# Patient Record
Sex: Female | Born: 1990
Health system: Southern US, Community
[De-identification: ages and names within clinical notes are randomized; demographics above are authoritative.]

## PROBLEM LIST (undated history)

## (undated) DIAGNOSIS — J45909 Unspecified asthma, uncomplicated: Secondary | ICD-10-CM

---

## 2014-11-18 ENCOUNTER — Emergency Department (HOSPITAL_BASED_OUTPATIENT_CLINIC_OR_DEPARTMENT_OTHER): Payer: Medicaid Other

## 2014-11-18 ENCOUNTER — Emergency Department (HOSPITAL_BASED_OUTPATIENT_CLINIC_OR_DEPARTMENT_OTHER)
Admission: EM | Admit: 2014-11-18 | Discharge: 2014-11-18 | Disposition: A | Discharge status: Home / Self Care | Payer: Medicaid Other | Attending: Emergency Medicine | Admitting: Emergency Medicine

## 2014-11-18 ENCOUNTER — Encounter (HOSPITAL_BASED_OUTPATIENT_CLINIC_OR_DEPARTMENT_OTHER): Payer: Self-pay | Admitting: Emergency Medicine

## 2014-11-18 DIAGNOSIS — Z3A00 Weeks of gestation of pregnancy not specified: Secondary | ICD-10-CM | POA: Diagnosis not present

## 2014-11-18 DIAGNOSIS — O209 Hemorrhage in early pregnancy, unspecified: Secondary | ICD-10-CM

## 2014-11-18 DIAGNOSIS — O98311 Other infections with a predominantly sexual mode of transmission complicating pregnancy, first trimester: Secondary | ICD-10-CM | POA: Insufficient documentation

## 2014-11-18 DIAGNOSIS — A6000 Herpesviral infection of urogenital system, unspecified: Secondary | ICD-10-CM

## 2014-11-18 DIAGNOSIS — A6004 Herpesviral vulvovaginitis: Secondary | ICD-10-CM | POA: Diagnosis not present

## 2014-11-18 LAB — URINE MICROSCOPIC-ADD ON

## 2014-11-18 LAB — CBC WITH DIFFERENTIAL/PLATELET
BASOS ABS: 0 10*3/uL (ref 0.0–0.1)
BASOS PCT: 0 % (ref 0–1)
EOS PCT: 1 % (ref 0–5)
Eosinophils Absolute: 0 10*3/uL (ref 0.0–0.7)
HCT: 33.2 % — ABNORMAL LOW (ref 36.0–46.0)
HEMOGLOBIN: 11.1 g/dL — AB (ref 12.0–15.0)
LYMPHS ABS: 0.6 10*3/uL — AB (ref 0.7–4.0)
LYMPHS PCT: 18 % (ref 12–46)
MCH: 23.3 pg — AB (ref 26.0–34.0)
MCHC: 33.4 g/dL (ref 30.0–36.0)
MCV: 69.7 fL — AB (ref 78.0–100.0)
Monocytes Absolute: 0.3 10*3/uL (ref 0.1–1.0)
Monocytes Relative: 8 % (ref 3–12)
NEUTROS ABS: 2.6 10*3/uL (ref 1.7–7.7)
NEUTROS PCT: 73 % (ref 43–77)
PLATELETS: 254 10*3/uL (ref 150–400)
RBC: 4.76 MIL/uL (ref 3.87–5.11)
RDW: 14.5 % (ref 11.5–15.5)
WBC: 3.5 10*3/uL — ABNORMAL LOW (ref 4.0–10.5)

## 2014-11-18 LAB — WET PREP, GENITAL
Trich, Wet Prep: NONE SEEN
YEAST WET PREP: NONE SEEN

## 2014-11-18 LAB — URINALYSIS, ROUTINE W REFLEX MICROSCOPIC
BILIRUBIN URINE: NEGATIVE
GLUCOSE, UA: NEGATIVE mg/dL
Ketones, ur: >80 mg/dL — AB
NITRITE: NEGATIVE
PROTEIN: NEGATIVE mg/dL
SPECIFIC GRAVITY, URINE: 1.031 — AB (ref 1.005–1.030)
Urobilinogen, UA: 2 mg/dL — ABNORMAL HIGH (ref 0.0–1.0)
pH: 6.5 (ref 5.0–8.0)

## 2014-11-18 LAB — RH IG WORKUP (INCLUDES ABO/RH)
ABO/RH(D): A POS
Gestational Age(Wks): 5

## 2014-11-18 LAB — BASIC METABOLIC PANEL
Anion gap: 8 (ref 5–15)
BUN: 8 mg/dL (ref 6–23)
CALCIUM: 8.8 mg/dL (ref 8.4–10.5)
CHLORIDE: 105 meq/L (ref 96–112)
CO2: 21 mmol/L (ref 19–32)
Creatinine, Ser: 0.69 mg/dL (ref 0.50–1.10)
GFR calc Af Amer: >90 mL/min (ref >90)
GFR calc non Af Amer: >90 mL/min (ref >90)
GLUCOSE: 78 mg/dL (ref 70–99)
Potassium: 3.3 mmol/L — ABNORMAL LOW (ref 3.5–5.1)
Sodium: 134 mmol/L — ABNORMAL LOW (ref 135–145)

## 2014-11-18 LAB — HCG, QUANTITATIVE, PREGNANCY: hCG, Beta Chain, Quant, S: 11201 m[IU]/mL — ABNORMAL HIGH (ref <5)

## 2014-11-18 LAB — PREGNANCY, URINE: PREG TEST UR: POSITIVE — AB

## 2014-11-18 MED ORDER — VALACYCLOVIR HCL 1 G PO TABS: 1000.0000 mg | ORAL_TABLET | Freq: Two times a day (BID) | ORAL | Status: AC

## 2014-11-18 MED ORDER — ACETAMINOPHEN 325 MG PO TABS
650.0000 mg | ORAL_TABLET | Freq: Once | ORAL | Status: AC
  Administered 2014-11-18: 650 mg via ORAL
  Filled 2014-11-18: qty 2

## 2014-11-18 NOTE — ED Notes (Signed)
LMP  10/09/14  spotting

## 2014-11-18 NOTE — ED Notes (Signed)
Pt states nausea and cramping off and on since yesterday and today she felt dizzy at work. Pt denies any change in daily eating/drinking habits or other symptoms.  Home pregnancy test showed positive per patient. Pt states vaginal bleeding/spotting noticed today.

## 2014-11-18 NOTE — ED Provider Notes (Signed)
CSN: [id number]     Arrival date & time 11/18/14  1513 History   First MD Initiated Contact with Patient 11/18/14 1714     No chief complaint on file.    (Consider location/radiation/quality/duration/timing/severity/associated sxs/prior Treatment) HPI Comments: Pt comes in with complaint of spotting during pregnancy. Pt states that she is having some lower abdominal cramping since yesterday. Spotting with urination. No previous pregnancy. Pt states that she also has a rash in her vaginal area that is painful if anything touches her. Denies vomiting or fever.  The history is provided by the patient. No language interpreter was used.    History reviewed. No pertinent past medical history. History reviewed. No pertinent past surgical history. No family history on file. History  Substance Use Topics  . Smoking status: Never Smoker   . Smokeless tobacco: Not on file  . Alcohol Use: No   OB History    Gravida Para Term Preterm AB TAB SAB Ectopic Multiple Living   1              Review of Systems  All other systems reviewed and are negative.     Allergies  Review of patient's allergies indicates not on file.  Home Medications   Prior to Admission medications   Not on File   BP 116/63 mmHg  Pulse 107  Temp(Src) 100.5 F (38.1 C) (Oral)  Resp 16  Ht  (1.626 m)  Wt 140 lb (63.504 kg)  BMI 24.02 kg/m2  SpO2 100%  LMP 10/09/2014 Physical Exam  Constitutional: She is oriented to person, place, and time. She appears well-developed and well-nourished.  Cardiovascular: Normal rate and regular rhythm.   Pulmonary/Chest: Effort normal and breath sounds normal.  Abdominal: Soft. Bowel sounds are normal. There is no tenderness.  Genitourinary:  Brown vaginal discharge. Indurated sores noted ot external labia  Musculoskeletal: Normal range of motion.  Neurological: She is alert and oriented to person, place, and time. Coordination normal.  Skin: Skin is warm and dry.   Psychiatric: She has a normal mood and affect.  Nursing note and vitals reviewed.   ED Course  Procedures (including critical care time) Labs Review Labs Reviewed  WET PREP, GENITAL - Abnormal; Notable for the following:    Clue Cells Wet Prep HPF POC FEW (*)    WBC, Wet Prep HPF POC FEW (*)    All other components within normal limits  URINALYSIS, ROUTINE W REFLEX MICROSCOPIC - Abnormal; Notable for the following:    Color, Urine AMBER (*)    APPearance CLOUDY (*)    Specific Gravity, Urine 1.031 (*)    Hgb urine dipstick MODERATE (*)    Ketones, ur >80 (*)    Urobilinogen, UA 2.0 (*)    Leukocytes, UA TRACE (*)    All other components within normal limits  PREGNANCY, URINE - Abnormal; Notable for the following:    Preg Test, Ur POSITIVE (*)    All other components within normal limits  URINE MICROSCOPIC-ADD ON - Abnormal; Notable for the following:    Squamous Epithelial / LPF FEW (*)    Bacteria, UA MANY (*)    All other components within normal limits  BASIC METABOLIC PANEL - Abnormal; Notable for the following:    Sodium 134 (*)    Potassium 3.3 (*)    All other components within normal limits  CBC WITH DIFFERENTIAL - Abnormal; Notable for the following:    WBC 3.5 (*)    Hemoglobin 11.1 (*)  HCT 33.2 (*)    MCV 69.7 (*)    MCH 23.3 (*)    Lymphs Abs 0.6 (*)    All other components within normal limits  HCG, QUANTITATIVE, PREGNANCY - Abnormal; Notable for the following:    hCG, Beta Chain, Quant, S 11201 (*)    All other components within normal limits  GC/CHLAMYDIA PROBE AMP  HERPES SIMPLEX VIRUS CULTURE  HIV ANTIBODY (ROUTINE TESTING)  RH IG WORKUP (INCLUDES ABO/RH)    Imaging Review US Ob Comp Less 14 Wks  11/18/2014   CLINICAL DATA:  Vaginal spotting, cramping, first trimester of pregnancy.  EXAM: OBSTETRIC <14 WK [place] AND TRANSVAGINAL OB US  TECHNIQUE: Both transabdominal and transvaginal ultrasound examinations were performed for complete evaluation of  the gestation as well as the maternal uterus, adnexal regions, and pelvic cul-de-sac. Transvaginal technique was performed to assess early pregnancy.  COMPARISON:  None.  FINDINGS: Intrauterine gestational sac: Visualized/normal in shape.  Yolk sac:  Not visualized.  Embryo:  Not visualized.  Cardiac Activity: Not visualized.  MSD:  11.6  mm   6 w   0  d  [place] [facility]: July 14, 2015.  Maternal uterus/adnexae: Normal right ovary. Trace free fluid is noted. 3.3 cm heterogeneous area seen in left ovary most consistent with corpus luteum cyst.  IMPRESSION: Probable early intrauterine gestational sac, but no yolk sac, fetal pole, or cardiac activity yet visualized. Recommend follow-up quantitative B-HCG levels and follow-up US in 14 days to confirm and assess viability. This recommendation follows SRU consensus guidelines: Diagnostic Criteria for Nonviable Pregnancy Early in the First Trimester. Malva Limes Med 2013; 914:7829-56.   Electronically Signed   By: Roque Lias M.D.   On: 11/18/2014 18:45   US Ob Transvaginal  11/18/2014   CLINICAL DATA:  Vaginal spotting, cramping, first trimester of pregnancy.  EXAM: OBSTETRIC <14 WK [place] AND TRANSVAGINAL OB US  TECHNIQUE: Both transabdominal and transvaginal ultrasound examinations were performed for complete evaluation of the gestation as well as the maternal uterus, adnexal regions, and pelvic cul-de-sac. Transvaginal technique was performed to assess early pregnancy.  COMPARISON:  None.  FINDINGS: Intrauterine gestational sac: Visualized/normal in shape.  Yolk sac:  Not visualized.  Embryo:  Not visualized.  Cardiac Activity: Not visualized.  MSD:  11.6  mm   6 w   0  d  [place] [facility]: July 14, 2015.  Maternal uterus/adnexae: Normal right ovary. Trace free fluid is noted. 3.3 cm heterogeneous area seen in left ovary most consistent with corpus luteum cyst.  IMPRESSION: Probable early intrauterine gestational sac, but no yolk sac, fetal pole, or cardiac activity yet visualized.  Recommend follow-up quantitative B-HCG levels and follow-up US in 14 days to confirm and assess viability. This recommendation follows SRU consensus guidelines: Diagnostic Criteria for Nonviable Pregnancy Early in the First Trimester. Malva Limes Med 2013; 213:0865-78.   Electronically Signed   By: Roque Lias M.D.   On: 11/18/2014 18:45     EKG Interpretation None      MDM   Final diagnoses:  Herpes genitalia  Vaginal bleeding in pregnancy, first trimester    Will treat for herpes. Pt a pos so no rho gam needed. [place] consistent with iup. Pt has ob at [facility]. Pt given return precuations    Teressa Lower, NP 11/18/14 2133  Rolan Bucco, MD 11/18/14 2243

## 2014-11-18 NOTE — Discharge Instructions (Signed)
You need to start taking your prenatal vitamins. You need to schedule and appointment with your ob. if if you bleed more then one pad per hour then you need to be seen again Genital Herpes Genital herpes is a sexually transmitted disease. This means that it is a disease passed by having sex with an infected person. There is no cure for genital herpes. The time between attacks can be months to years. The virus may live in a person but produce no problems (symptoms). This infection can be passed to a baby as it travels down the birth canal (vagina). In a newborn, this can cause central nervous system damage, eye damage, or even death. The virus that causes genital herpes is usually HSV-2 virus. The virus that causes oral herpes is usually HSV-1. The diagnosis (learning what is wrong) is made through culture results. SYMPTOMS  Usually symptoms of pain and itching begin a few days to a week after contact. It first appears as small blisters that progress to small painful ulcers which then scab over and heal after several days. It affects the outer genitalia, birth canal, cervix, penis, anal area, buttocks, and thighs. HOME CARE INSTRUCTIONS   Keep ulcerated areas dry and clean.  Take medications as directed. Antiviral medications can speed up healing. They will not prevent recurrences or cure this infection. These medications can also be taken for suppression if there are frequent recurrences.  While the infection is active, it is contagious. Avoid all sexual contact during active infections.  Condoms may help prevent spread of the herpes virus.  Practice safe sex.  Wash your hands thoroughly after touching the genital area.  Avoid touching your eyes after touching your genital area.  Inform your caregiver if you have had genital herpes and become pregnant. It is your responsibility to insure a safe outcome for your baby in this pregnancy.  Only take over-the-counter or prescription medicines for  pain, discomfort, or fever as directed by your caregiver. SEEK MEDICAL CARE IF:   You have a recurrence of this infection.  You do not respond to medications and are not improving.  You have new sources of pain or discharge which have changed from the original infection.  You have an oral temperature above 102 F (38.9 C).  You develop abdominal pain.  You develop eye pain or signs of eye infection. Document Released: 11/01/2000 Document Revised: 01/27/2012 Document Reviewed: 11/22/2009 [facility] Patient Information 2015 [place], [place]. This information is not intended to replace advice given to you by your health care provider. Make sure you discuss any questions you have with your health care provider.  Vaginal Bleeding During Pregnancy, First Trimester A small amount of bleeding (spotting) from the vagina is common in early pregnancy. Sometimes the bleeding is normal and is not a problem, and sometimes it is a sign of something serious. Be sure to tell your doctor about any bleeding from your vagina right away. HOME CARE  Watch your condition for any changes.  Follow your doctor's instructions about how active you can be.  If you are on bed rest:  You may need to stay in bed and only get up to use the bathroom.  You may be allowed to do some activities.  If you need help, make plans for someone to help you.  Write down:  The number of pads you use each day.  How often you change pads.  How soaked (saturated) your pads are.  Do not use tampons.  Do not  douche.  Do not have sex or orgasms until your doctor says it is okay.  If you pass any tissue from your vagina, save the tissue so you can show it to your doctor.  Only take medicines as told by your doctor.  Do not take aspirin because it can make you bleed.  Keep all follow-up visits as told by your doctor. GET HELP IF:   You bleed from your vagina.  You have cramps.  You have labor pains.  You have a  fever that does not go away after you take medicine. GET HELP RIGHT AWAY IF:   You have very bad cramps in your back or belly (abdomen).  You pass large clots or tissue from your vagina.  You bleed more.  You feel light-headed or weak.  You pass out (faint).  You have chills.  You are leaking fluid or have a gush of fluid from your vagina.  You pass out while pooping (having a bowel movement). MAKE SURE YOU:  Understand these instructions.  Will watch your condition.  Will get help right away if you are not doing well or get worse. Document Released: 03/21/2014 Document Reviewed: 07/12/2013 [facility] Patient Information 2015 [place], [place]. This information is not intended to replace advice given to you by your health care provider. Make sure you discuss any questions you have with your health care provider.

## 2014-11-20 LAB — HIV ANTIBODY (ROUTINE TESTING W REFLEX): HIV 1&2 Ab, 4th Generation: NONREACTIVE

## 2014-11-21 LAB — HERPES SIMPLEX VIRUS CULTURE: CULTURE: NOT DETECTED

## 2014-11-23 LAB — GC/CHLAMYDIA PROBE AMP
CT Probe RNA: POSITIVE — AB
GC Probe RNA: POSITIVE — AB

## 2014-11-24 ENCOUNTER — Telehealth (HOSPITAL_BASED_OUTPATIENT_CLINIC_OR_DEPARTMENT_OTHER): Payer: Self-pay | Admitting: Emergency Medicine

## 2014-11-24 NOTE — Telephone Encounter (Signed)
+  GC and + chlamydia, not treated, chart handoff to EDP for treatment

## 2014-11-28 ENCOUNTER — Telehealth (HOSPITAL_BASED_OUTPATIENT_CLINIC_OR_DEPARTMENT_OTHER): Payer: Self-pay | Admitting: *Deleted

## 2014-11-29 ENCOUNTER — Telehealth (HOSPITAL_BASED_OUTPATIENT_CLINIC_OR_DEPARTMENT_OTHER): Payer: Self-pay | Admitting: *Deleted

## 2014-11-30 ENCOUNTER — Telehealth (HOSPITAL_COMMUNITY): Payer: Self-pay

## 2014-11-30 NOTE — ED Notes (Signed)
Spoke with pt. Verified ID. Informed of lab results. Positive for gonorrhea and chlamydia. Pt states that she has already been informed and was treated. Does not remember the name of the medication. It was a one time only medication. She has an MD appointment next week. I advised that she ask to be retested to make sure everything is clear.

## 2015-10-07 ENCOUNTER — Emergency Department (HOSPITAL_BASED_OUTPATIENT_CLINIC_OR_DEPARTMENT_OTHER)
Admission: EM | Admit: 2015-10-07 | Discharge: 2015-10-07 | Disposition: A | Discharge status: Home / Self Care | Payer: Medicaid Other | Attending: Emergency Medicine | Admitting: Emergency Medicine

## 2015-10-07 ENCOUNTER — Encounter (HOSPITAL_BASED_OUTPATIENT_CLINIC_OR_DEPARTMENT_OTHER): Payer: Self-pay | Admitting: *Deleted

## 2015-10-07 DIAGNOSIS — Z79899 Other long term (current) drug therapy: Secondary | ICD-10-CM | POA: Insufficient documentation

## 2015-10-07 DIAGNOSIS — O9989 Other specified diseases and conditions complicating pregnancy, childbirth and the puerperium: Secondary | ICD-10-CM | POA: Diagnosis not present

## 2015-10-07 DIAGNOSIS — Z3A21 21 weeks gestation of pregnancy: Secondary | ICD-10-CM | POA: Diagnosis not present

## 2015-10-07 DIAGNOSIS — J069 Acute upper respiratory infection, unspecified: Secondary | ICD-10-CM | POA: Diagnosis not present

## 2015-10-07 DIAGNOSIS — O99512 Diseases of the respiratory system complicating pregnancy, second trimester: Secondary | ICD-10-CM | POA: Insufficient documentation

## 2015-10-07 DIAGNOSIS — J45901 Unspecified asthma with (acute) exacerbation: Secondary | ICD-10-CM | POA: Diagnosis not present

## 2015-10-07 DIAGNOSIS — H9209 Otalgia, unspecified ear: Secondary | ICD-10-CM | POA: Diagnosis not present

## 2015-10-07 HISTORY — DX: Unspecified asthma, uncomplicated: J45.909

## 2015-10-07 MED ORDER — ALBUTEROL SULFATE HFA 108 (90 BASE) MCG/ACT IN AERS: 1.0000 | INHALATION_SPRAY | Freq: Four times a day (QID) | RESPIRATORY_TRACT | Status: DC | PRN

## 2015-10-07 MED ORDER — IPRATROPIUM-ALBUTEROL 0.5-2.5 (3) MG/3ML IN SOLN: 3.0000 mL | RESPIRATORY_TRACT | Status: DC | PRN

## 2015-10-07 MED ORDER — AEROCHAMBER PLUS FLO-VU SMALL MISC
1.0000 | Freq: Once | Status: AC
  Administered 2015-10-07: 1
  Filled 2015-10-07: qty 1

## 2015-10-07 MED ORDER — PREDNISONE 10 MG PO TABS: 40.0000 mg | ORAL_TABLET | Freq: Every day | ORAL | Status: DC

## 2015-10-07 NOTE — ED Provider Notes (Signed)
CSN: [id number]     Arrival date & time 10/07/15  1359 History   First MD Initiated Contact with Patient 10/07/15 1404     Chief Complaint  Patient presents with  . Shortness of Breath   HPI  [name] is a 24 year old female with PMHx of asthma presenting with shortness of breath. She states that she has had congestion for the past 2 days and this has exacerbated her asthma. She states that the congestion makes it feel like her ears are clogged up. She is also complaining of drainage down the back of her throat. States that it doesn't make her throat sore but that it just feels "gross". She has not taken any over-the-counter symptom relievers. She states that she is only short of breath when she is up and moving around. She states that if she is resting the shortness of breath resolves. She is not currently complaining of shortness of breath. She reports that she is out of her rescue inhaler and has not been able to use it in over a week. She is also out of her home nebulizer treatments. She is [redacted] weeks pregnant with appropriate prenatal care. She denies any issues with the pregnancy. She denies fevers, chills, headaches, dizziness, syncope, neck pain, chest pain, cough, chest tightness, abdominal pain, nausea, vomiting, dysuria or vaginal bleeding.  Past Medical History  Diagnosis Date  . Asthma    History reviewed. No pertinent past surgical history. No family history on file. Social History  Substance Use Topics  . Smoking status: Never Smoker   . Smokeless tobacco: None  . Alcohol Use: No   OB History    Gravida Para Term Preterm AB TAB SAB Ectopic Multiple Living   1              Review of Systems  Constitutional: Negative for fever, chills and diaphoresis.  HENT: Positive for congestion, ear pain and postnasal drip. Negative for ear discharge, rhinorrhea, sinus pressure, sore throat and trouble swallowing.   Eyes: Negative for discharge.  Respiratory: Positive for shortness  of breath. Negative for cough, chest tightness and wheezing.   Cardiovascular: Negative for chest pain.  Gastrointestinal: Negative for nausea, vomiting and abdominal pain.  Genitourinary: Negative for dysuria, flank pain and vaginal bleeding.  Musculoskeletal: Negative for myalgias and arthralgias.  Neurological: Negative for dizziness, syncope, weakness and headaches.  All other systems reviewed and are negative.     Allergies  Review of patient's allergies indicates no known allergies.  Home Medications   Prior to Admission medications   Medication Sig Start Date End Date Taking? Authorizing Provider  Prenatal Multivit-Min-Fe-FA (PRENATAL VITAMINS) 0.8 MG tablet Take 1 tablet by mouth daily.   Yes Historical Provider, MD  albuterol (PROVENTIL HFA;VENTOLIN HFA) 108 (90 BASE) MCG/ACT inhaler Inhale 1-2 puffs into the lungs every 6 (six) hours as needed for wheezing or shortness of breath. 10/07/15   Kalab Camps, PA-C  ipratropium-albuterol (DUONEB) 0.5-2.5 (3) MG/3ML SOLN Take 3 mLs by nebulization every 2 (two) hours as needed. 10/07/15   Vikrant Pryce, PA-C  predniSONE (DELTASONE) 10 MG tablet Take 4 tablets (40 mg total) by mouth daily. 10/07/15   Rebekha Diveley, PA-C   BP 112/67 mmHg  Pulse 92  Temp(Src) 98.5 F (36.9 C) (Oral)  Resp 26  Ht 5\' 4"  (1.626 m)  Wt 164 lb (74.39 kg)  BMI 28.14 kg/m2  SpO2 100%  LMP 10/09/2014 Physical Exam  Constitutional: She appears well-developed and well-nourished. No  distress.  HENT:  Head: Normocephalic and atraumatic.  Right Ear: Tympanic membrane, external ear and ear canal normal.  Left Ear: Tympanic membrane, external ear and ear canal normal.  Nose: Mucosal edema present. No rhinorrhea.  Mouth/Throat: Uvula is midline, oropharynx is clear and moist and mucous membranes are normal. Mucous membranes are not dry. No oropharyngeal exudate, posterior oropharyngeal edema or posterior oropharyngeal erythema.  Eyes: Conjunctivae are  normal. Right eye exhibits no discharge. Left eye exhibits no discharge. No scleral icterus.  Neck: Normal range of motion. Neck supple.  Cardiovascular: Normal rate, regular rhythm and normal heart sounds.   Pulmonary/Chest: Effort normal and breath sounds normal. No respiratory distress. She has no wheezes. She has no rales.  Breathing unlabored. No wheezes throughout lung fields.  Musculoskeletal: Normal range of motion.  Lymphadenopathy:    She has no cervical adenopathy.  Neurological: She is alert. Coordination normal.  Skin: Skin is warm and dry.  Psychiatric: She has a normal mood and affect. Her behavior is normal.  Nursing note and vitals reviewed.   ED Course  Procedures (including critical care time) Labs Review Labs Reviewed - No data to display  Imaging Review No results found. I have personally reviewed and evaluated these images and lab results as part of my medical decision-making.   EKG Interpretation None      MDM   Final diagnoses:  Upper respiratory infection, viral  Asthma exacerbation   Patient presenting with congestion and shortness of breath x 2 days. Denies wheezing or cough. She has not tried any over-the-counter symptom relievers. She is out of her rescue inhaler and DuoNeb solution at home. She is not currently short of breath. Vital signs stable. Oxygen 100% on room air. Breathing unlabored. No wheezes or adventitious breath sounds heard throughout. Some nasal mucosal edema noted. TMs normal bilaterally. Oropharynx is not erythematous and without exudate. There are no current signs of respiratory distress. Patient will be discharged with refills for her albuterol and DuoNeb. Pt states they are breathing at baseline. Discussed with patient the risks of taking prednisone while pregnant. Discussed that we will give a very short, low dose prescription burst that she is only to take if her breathing does not improve with albuterol or DuoNebs. Pt has been  instructed to follow up with their PCP regarding today's ED visit. Patient seen and assessed by Dr. Madilyn Hook who agrees with this plan. Return precautions given in discharge paperwork and discussed with pt at bedside. Pt stable for discharge      Alveta Heimlich, PA-C 10/07/15 2341  Tilden Fossa, MD 10/08/15 1718

## 2015-10-07 NOTE — ED Notes (Signed)
Patient c/o SOB that has grown worse over the past two days, she states that she has asthma but does not have an inhaler at this time. [redacted] weeks pregnant

## 2015-10-07 NOTE — Discharge Instructions (Signed)
Schedule a follow-up appointment with your primary care for asthma control. Only use the prednisone if your asthma symptoms are not controlled with your rescue inhaler and DuoNeb. Return to the emergency department with difficulty breathing or further worsening of symptoms.   Asthma, Adult Asthma is a recurring condition in which the airways tighten and narrow. Asthma can make it difficult to breathe. It can cause coughing, wheezing, and shortness of breath. Asthma episodes, also called asthma attacks, range from minor to life-threatening. Asthma cannot be cured, but medicines and lifestyle changes can help control it. CAUSES Asthma is believed to be caused by inherited (genetic) and environmental factors, but its exact cause is unknown. Asthma may be triggered by allergens, lung infections, or irritants in the air. Asthma triggers are different for each person. Common triggers include:   Animal dander.  Dust mites.  Cockroaches.  Pollen from trees or grass.  Mold.  Smoke.  Air pollutants such as dust, household cleaners, hair sprays, aerosol sprays, paint fumes, strong chemicals, or strong odors.  Cold air, weather changes, and winds (which increase molds and pollens in the air).  Strong emotional expressions such as crying or laughing hard.  Stress.  Certain medicines (such as aspirin) or types of drugs (such as beta-blockers).  Sulfites in foods and drinks. Foods and drinks that may contain sulfites include dried fruit, potato chips, and sparkling grape juice.  Infections or inflammatory conditions such as the flu, a cold, or an inflammation of the nasal membranes (rhinitis).  Gastroesophageal reflux disease (GERD).  Exercise or strenuous activity. SYMPTOMS Symptoms may occur immediately after asthma is triggered or many hours later. Symptoms include:  Wheezing.  Excessive nighttime or early morning coughing.  Frequent or severe coughing with a common cold.  Chest  tightness.  Shortness of breath. DIAGNOSIS  The diagnosis of asthma is made by a review of your medical history and a physical exam. Tests may also be performed. These may include:  Lung function studies. These tests show how much air you breathe in and out.  Allergy tests.  Imaging tests such as X-rays. TREATMENT  Asthma cannot be cured, but it can usually be controlled. Treatment involves identifying and avoiding your asthma triggers. It also involves medicines. There are 2 classes of medicine used for asthma treatment:   Controller medicines. These prevent asthma symptoms from occurring. They are usually taken every day.  Reliever or rescue medicines. These quickly relieve asthma symptoms. They are used as needed and provide short-term relief. Your health care provider will help you create an asthma action plan. An asthma action plan is a written plan for managing and treating your asthma attacks. It includes a list of your asthma triggers and how they may be avoided. It also includes information on when medicines should be taken and when their dosage should be changed. An action plan may also involve the use of a device called a peak flow meter. A peak flow meter measures how well the lungs are working. It helps you monitor your condition. HOME CARE INSTRUCTIONS   Take medicines only as directed by your health care provider. Speak with your health care provider if you have questions about how or when to take the medicines.  Use a peak flow meter as directed by your health care provider. Record and keep track of readings.  Understand and use the action plan to help minimize or stop an asthma attack without needing to seek medical care.  Control your home environment in the  following ways to help prevent asthma attacks:  Do not smoke. Avoid being exposed to secondhand smoke.  Change your heating and air conditioning filter regularly.  Limit your use of fireplaces and wood  stoves.  Get rid of pests (such as roaches and mice) and their droppings.  Throw away plants if you see mold on them.  Clean your floors and dust regularly. Use unscented cleaning products.  Try to have someone else vacuum for you regularly. Stay out of rooms while they are being vacuumed and for a short while afterward. If you vacuum, use a dust mask from a hardware store, a double-layered or microfilter vacuum cleaner bag, or a vacuum cleaner with a HEPA filter.  Replace carpet with wood, tile, or vinyl flooring. Carpet can trap dander and dust.  Use allergy-proof pillows, mattress covers, and box spring covers.  Wash bed sheets and blankets every week in hot water and dry them in a dryer.  Use blankets that are made of polyester or cotton.  Clean bathrooms and kitchens with bleach. If possible, have someone repaint the walls in these rooms with mold-resistant paint. Keep out of the rooms that are being cleaned and painted.  Wash hands frequently. SEEK MEDICAL CARE IF:   You have wheezing, shortness of breath, or a cough even if taking medicine to prevent attacks.  The colored mucus you cough up (sputum) is thicker than usual.  Your sputum changes from clear or white to yellow, green, gray, or bloody.  You have any problems that may be related to the medicines you are taking (such as a rash, itching, swelling, or trouble breathing).  You are using a reliever medicine more than 2-3 times per week.  Your peak flow is still at 50-79% of your personal best after following your action plan for 1 hour.  You have a fever. SEEK IMMEDIATE MEDICAL CARE IF:   You seem to be getting worse and are unresponsive to treatment during an asthma attack.  You are short of breath even at rest.  You get short of breath when doing very little physical activity.  You have difficulty eating, drinking, or talking due to asthma symptoms.  You develop chest pain.  You develop a fast  heartbeat.  You have a bluish color to your lips or fingernails.  You are light-headed, dizzy, or faint.  Your peak flow is less than 50% of your personal best.   This information is not intended to replace advice given to you by your health care provider. Make sure you discuss any questions you have with your health care provider.   Document Released: 11/04/2005 Document Revised: 07/26/2015 Document Reviewed: 06/03/2013 Elsevier Interactive Patient Education 2016 Else[employer] Inc.  Viral Infections A viral infection can be caused by different types of viruses.Most viral infections are not serious and resolve on their own. However, some infections may cause severe symptoms and may lead to further complications. SYMPTOMS Viruses can frequently cause:  Minor sore throat.  Aches and pains.  Headaches.  Runny nose.  Different types of rashes.  Watery eyes.  Tiredness.  Cough.  Loss of appetite.  Gastrointestinal infections, resulting in nausea, vomiting, and diarrhea. These symptoms do not respond to antibiotics because the infection is not caused by bacteria. However, you might catch a bacterial infection following the viral infection. This is sometimes called a "superinfection." Symptoms of such a bacterial infection may include:  Worsening sore throat with pus and difficulty swallowing.  Swollen neck glands.  Chills and  a high or persistent fever.  Severe headache.  Tenderness over the sinuses.  Persistent overall ill feeling (malaise), muscle aches, and tiredness (fatigue).  Persistent cough.  Yellow, green, or brown mucus production with coughing. HOME CARE INSTRUCTIONS   Only take over-the-counter or prescription medicines for pain, discomfort, diarrhea, or fever as directed by your caregiver.  Drink enough water and fluids to keep your urine clear or pale yellow. Sports drinks can provide valuable electrolytes, sugars, and hydration.  Get plenty of rest  and maintain proper nutrition. Soups and broths with crackers or rice are fine. SEEK IMMEDIATE MEDICAL CARE IF:   You have severe headaches, shortness of breath, chest pain, neck pain, or an unusual rash.  You have uncontrolled vomiting, diarrhea, or you are unable to keep down fluids.  You or your child has an oral temperature above 102 F (38.9 C), not controlled by medicine.  Your baby is older than 3 months with a rectal temperature of 102 F (38.9 C) or higher.  Your baby is 84 months old or younger with a rectal temperature of 100.4 F (38 C) or higher. MAKE SURE YOU:   Understand these instructions.  Will watch your condition.  Will get help right away if you are not doing well or get worse.   This information is not intended to replace advice given to you by your health care provider. Make sure you discuss any questions you have with your health care provider.   Document Released: 08/14/2005 Document Revised: 01/27/2012 Document Reviewed: 04/12/2015 Elsevier Interactive Patient Education [employer]

## 2017-08-18 ENCOUNTER — Inpatient Hospital Stay (HOSPITAL_BASED_OUTPATIENT_CLINIC_OR_DEPARTMENT_OTHER)
Admission: EM | Admit: 2017-08-18 | Discharge: 2017-08-20 | DRG: 203 | Disposition: A | Discharge status: Home / Self Care | Payer: Medicaid Other | Source: Other Acute Inpatient Hospital | Attending: Internal Medicine | Admitting: Internal Medicine

## 2017-08-18 ENCOUNTER — Encounter (HOSPITAL_BASED_OUTPATIENT_CLINIC_OR_DEPARTMENT_OTHER): Payer: Self-pay

## 2017-08-18 ENCOUNTER — Emergency Department (HOSPITAL_BASED_OUTPATIENT_CLINIC_OR_DEPARTMENT_OTHER): Payer: Medicaid Other

## 2017-08-18 DIAGNOSIS — J4551 Severe persistent asthma with (acute) exacerbation: Secondary | ICD-10-CM | POA: Diagnosis not present

## 2017-08-18 DIAGNOSIS — E876 Hypokalemia: Secondary | ICD-10-CM | POA: Diagnosis present

## 2017-08-18 DIAGNOSIS — J4552 Severe persistent asthma with status asthmaticus: Secondary | ICD-10-CM | POA: Diagnosis present

## 2017-08-18 DIAGNOSIS — J45902 Unspecified asthma with status asthmaticus: Secondary | ICD-10-CM | POA: Diagnosis not present

## 2017-08-18 DIAGNOSIS — Z79899 Other long term (current) drug therapy: Secondary | ICD-10-CM | POA: Diagnosis not present

## 2017-08-18 DIAGNOSIS — Z7952 Long term (current) use of systemic steroids: Secondary | ICD-10-CM | POA: Diagnosis not present

## 2017-08-18 DIAGNOSIS — J45901 Unspecified asthma with (acute) exacerbation: Secondary | ICD-10-CM | POA: Diagnosis present

## 2017-08-18 LAB — BASIC METABOLIC PANEL
ANION GAP: 9 (ref 5–15)
BUN: 6 mg/dL (ref 6–20)
CO2: 19 mmol/L — ABNORMAL LOW (ref 22–32)
Calcium: 9 mg/dL (ref 8.9–10.3)
Chloride: 109 mmol/L (ref 101–111)
Creatinine, Ser: 0.75 mg/dL (ref 0.44–1.00)
Glucose, Bld: 106 mg/dL — ABNORMAL HIGH (ref 65–99)
Potassium: 2.9 mmol/L — ABNORMAL LOW (ref 3.5–5.1)
SODIUM: 137 mmol/L (ref 135–145)

## 2017-08-18 LAB — CBC WITH DIFFERENTIAL/PLATELET
BASOS ABS: 0 10*3/uL (ref 0.0–0.1)
Basophils Relative: 0 %
EOS ABS: 0.1 10*3/uL (ref 0.0–0.7)
Eosinophils Relative: 2 %
HCT: 36.2 % (ref 36.0–46.0)
Hemoglobin: 12 g/dL (ref 12.0–15.0)
Lymphocytes Relative: 6 %
Lymphs Abs: 0.4 10*3/uL — ABNORMAL LOW (ref 0.7–4.0)
MCH: 22.9 pg — ABNORMAL LOW (ref 26.0–34.0)
MCHC: 33.1 g/dL (ref 30.0–36.0)
MCV: 69 fL — ABNORMAL LOW (ref 78.0–100.0)
MONO ABS: 0.4 10*3/uL (ref 0.1–1.0)
Monocytes Relative: 6 %
NEUTROS PCT: 86 %
Neutro Abs: 5.8 10*3/uL (ref 1.7–7.7)
PLATELETS: 359 10*3/uL (ref 150–400)
RBC: 5.25 MIL/uL — AB (ref 3.87–5.11)
RDW: 14.9 % (ref 11.5–15.5)
WBC: 6.7 10*3/uL (ref 4.0–10.5)

## 2017-08-18 LAB — D-DIMER, QUANTITATIVE (NOT AT ARMC)

## 2017-08-18 MED ORDER — ALBUTEROL SULFATE (2.5 MG/3ML) 0.083% IN NEBU: 2.5000 mg | INHALATION_SOLUTION | RESPIRATORY_TRACT | Status: DC | PRN

## 2017-08-18 MED ORDER — MAGNESIUM SULFATE 2 GM/50ML IV SOLN
2.0000 g | Freq: Once | INTRAVENOUS | Status: AC
  Administered 2017-08-18: 2 g via INTRAVENOUS
  Filled 2017-08-18: qty 50

## 2017-08-18 MED ORDER — ONDANSETRON HCL 4 MG PO TABS: 4.0000 mg | ORAL_TABLET | Freq: Four times a day (QID) | ORAL | Status: DC | PRN

## 2017-08-18 MED ORDER — POTASSIUM CHLORIDE CRYS ER 20 MEQ PO TBCR
40.0000 meq | EXTENDED_RELEASE_TABLET | Freq: Once | ORAL | Status: AC
  Administered 2017-08-18: 40 meq via ORAL
  Filled 2017-08-18: qty 2

## 2017-08-18 MED ORDER — ONDANSETRON HCL 4 MG/2ML IJ SOLN
4.0000 mg | Freq: Once | INTRAMUSCULAR | Status: AC
  Administered 2017-08-18: 4 mg via INTRAVENOUS
  Filled 2017-08-18: qty 2

## 2017-08-18 MED ORDER — IPRATROPIUM-ALBUTEROL 0.5-2.5 (3) MG/3ML IN SOLN
3.0000 mL | Freq: Four times a day (QID) | RESPIRATORY_TRACT | Status: DC
  Administered 2017-08-18 – 2017-08-19 (×4): 3 mL via RESPIRATORY_TRACT
  Filled 2017-08-18 (×4): qty 3

## 2017-08-18 MED ORDER — ALBUTEROL (5 MG/ML) CONTINUOUS INHALATION SOLN
10.0000 mg/h | INHALATION_SOLUTION | Freq: Once | RESPIRATORY_TRACT | Status: AC
  Administered 2017-08-18: 10 mg/h via RESPIRATORY_TRACT
  Filled 2017-08-18: qty 20

## 2017-08-18 MED ORDER — ONDANSETRON HCL 4 MG/2ML IJ SOLN: 4.0000 mg | Freq: Four times a day (QID) | INTRAMUSCULAR | Status: DC | PRN

## 2017-08-18 MED ORDER — ACETAMINOPHEN 650 MG RE SUPP: 650.0000 mg | Freq: Four times a day (QID) | RECTAL | Status: DC | PRN

## 2017-08-18 MED ORDER — ACETAMINOPHEN 325 MG PO TABS
650.0000 mg | ORAL_TABLET | Freq: Four times a day (QID) | ORAL | Status: DC | PRN
  Administered 2017-08-18: 650 mg via ORAL
  Filled 2017-08-18: qty 2

## 2017-08-18 MED ORDER — ALBUTEROL SULFATE (2.5 MG/3ML) 0.083% IN NEBU
INHALATION_SOLUTION | RESPIRATORY_TRACT | Status: AC
  Administered 2017-08-18: 5 mg
  Filled 2017-08-18: qty 6

## 2017-08-18 MED ORDER — SODIUM CHLORIDE 0.9 % IV BOLUS (SEPSIS)
500.0000 mL | Freq: Once | INTRAVENOUS | Status: AC
  Administered 2017-08-18: 500 mL via INTRAVENOUS

## 2017-08-18 MED ORDER — METHYLPREDNISOLONE SODIUM SUCC 125 MG IJ SOLR
125.0000 mg | Freq: Once | INTRAMUSCULAR | Status: AC
  Administered 2017-08-18: 125 mg via INTRAVENOUS
  Filled 2017-08-18: qty 2

## 2017-08-18 MED ORDER — METHYLPREDNISOLONE SODIUM SUCC 125 MG IJ SOLR
60.0000 mg | Freq: Two times a day (BID) | INTRAMUSCULAR | Status: DC
  Administered 2017-08-18 – 2017-08-20 (×4): 60 mg via INTRAVENOUS
  Filled 2017-08-18 (×4): qty 2

## 2017-08-18 NOTE — H&P (Signed)
History and Physical    Laura [name] ZOX:[id number] DOB: 06/13/1991 DOA: 08/18/2017  PCP:  Ferdinand Cava - OB/GYN Consultants:  [place] - pulmonologist Patient coming from: Home - lives with mother, daughter; Jackey Loge: mom, [phone]  Chief Complaint: Wheezing, SOB  HPI: Laura [name] is a 26 y.o. female with medical history significant of asthma presenting as a transfer from [facility] due to asthma attack.  She normally has an asthma attack and knows what to expect.  If she can't control it at home she goes to the ER for a treatment and does well.  Today, the dry cough is different, the wheezing is different.  It isn't a normal asthma attack in that she used nebs and her inhaler without improvement.  She started feeling bad on Sunday night - at first she was just stuffy and thought it was allergies or a cold.  About 11pm, her breathing started acting up but it calmed and was better through 0400.  When it worsened, she did a neb without improvement.  Tried a second neb without relief and so went to [facility].  Currently with still some wheezes.  She moved a lot with the transport and that made things worse again.  Tmax 100.8.  Her daughter had a URI last week on Thursday.   ED Course: Persistent dyspnea, tachycardia, wheezing despite 3 treatments including a continuous neb.  Sats down to 89% on room air.  Gave mag and solumedrol.  Review of Systems: As per HPI; otherwise review of systems reviewed and negative.   Ambulatory Status:  Ambulates without assistance  Past Medical History:  Diagnosis Date  . Asthma     History reviewed. No pertinent surgical history.  Social History   Social History  . Marital status: Single    Spouse name: N/A  . Number of children: N/A  . Years of education: N/A   Occupational History  . 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 Smoking status: Never Smoker  . Smokeless tobacco: Never Used  . Alcohol use No  . Drug  use: No  . Sexual activity: Not on file   Other Topics Concern  . Not on file   Social History Narrative  . No narrative on file    No Known Allergies  Family History  Problem Relation Age of Onset  . Asthma Father   . Asthma Maternal Grandmother     Prior to Admission medications   Medication Sig Start Date End Date Taking? Authorizing Provider  predniSONE (DELTASONE) 10 MG tablet Take 4 tablets (40 mg total) by mouth daily. 10/07/15  Yes Barrett, Rolm Gala, PA-C  Prenatal Multivit-Min-Fe-FA (PRENATAL VITAMINS) 0.8 MG tablet Take 1 tablet by mouth daily.   Yes [provider]  albuterol (PROVENTIL HFA;VENTOLIN HFA) 108 (90 BASE) MCG/ACT inhaler Inhale 1-2 puffs into the lungs every 6 (six) hours as needed for wheezing or shortness of breath. 10/07/15   Barrett, Rolm Gala, PA-C  ipratropium-albuterol (DUONEB) 0.5-2.5 (3) MG/3ML SOLN Take 3 mLs by nebulization every 2 (two) hours as needed. 10/07/15   Alveta Heimlich, PA-C    Physical Exam: Vitals:   08/18/17 1815 08/18/17 1957 08/18/17 2001 08/18/17 2138  BP: 124/68   125/81  Pulse: (!) 128   (!) 123  Resp:    18  Temp: 99.1 F (37.3 C)   98.4 F (36.9 C)  TempSrc: Oral   Oral  SpO2: 92% 90% 93% 94%  Weight: 81.8  kg (180 lb 5.4 oz)     Height:  (1.626 m)        General:  Appears calm and comfortable and is NAD Eyes:  PERRL, EOMI, normal lids, iris ENT:  grossly normal hearing, lips & tongue, mmm; appropriate dentition Neck:  no LAD, masses or thyromegaly; no carotid bruits Cardiovascular:  Persistent tachycardia, no m/r/g. No LE edema.  Respiratory:  Diffuse wheezing, poor to moderate air movement.  Increased respiratory effort. Abdomen:  soft, NT, ND, NABS Back:   normal alignment, no CVAT Skin:  no rash or induration seen on limited exam Musculoskeletal:  grossly normal tone BUE/BLE, good ROM, no bony abnormality Psychiatric:  grossly normal mood and affect, speech fluent and appropriate, AOx3 Neurologic:   CN 2-12 grossly intact, moves all extremities in coordinated fashion, sensation intact    Radiological Exams on Admission: Dg Chest 2 View  Result Date: 08/18/2017 CLINICAL DATA:  Shortness of breath.  Chest pain and tightness EXAM: CHEST  2 VIEW COMPARISON:  None available FINDINGS: Mildly low lung volumes with interstitial crowding. There is no edema, consolidation, effusion, or pneumothorax. Normal heart size and mediastinal contours. IMPRESSION: Negative low volume chest. Electronically Signed   By: Marnee Spring M.D.   On: 08/18/2017 12:32    EKG: Independently reviewed.  Sinus tachycardia with rate 134; nonspecific ST changes that are likely rate-related  Labs on Admission: I have personally reviewed the available labs and imaging studies at the time of the admission.  Pertinent labs:   K+ 2.9 CO2 19 Glucose 106 WBC 6.7 D-dimer <0.27  Assessment/Plan Principal Problem:   Asthma exacerbation Active Problems:   Hypokalemia   Asthma exacerbation -She has history of moderate to severe persistent asthma without prior need for hospitalization. -Patient's daughter with recent URI, which is likely patient's trigger. -CXR negative for PNA; normal WBC count -will admit the patient (accepted in transfer from [facility]) -Nebulizers: prn albuterol and Duonebs -Solu-Medrol 60 mg IV BID -No antibiotics at this time  -Received magnesium in the ER as well as continuous neb  Hypokalemia -Repleted in ER.  Will follow.  Will check Mag level (although she received Mag in the ER).  DVT prophylaxis: Early ambulation Code Status:  Full - confirmed with patient Family Communication: None present  Disposition Plan:  Home once clinically improved Consults called: None  Admission status: Admit - It is my clinical opinion that admission to INPATIENT is reasonable and necessary because this patient will require at least 2 midnights in the hospital to treat this condition based on the medical  complexity of the problems presented.  Given the aforementioned information, the predictability of an adverse outcome is felt to be significant.    Jonah Blue MD [facility]  If note is complete, please contact covering daytime or nighttime physician. [email or web address] Password [email or web address]  08/19/2017, 12:59 AM

## 2017-08-18 NOTE — ED Notes (Signed)
Reports using multiple treatments and rescue inhalers with no relief. A/O   EDP at bedside.

## 2017-08-18 NOTE — ED Triage Notes (Signed)
Pt reports SOB since 5pm yesterday. Pt has hx of asthma, pt had a breathing treatment prior to arrival with no relief.

## 2017-08-18 NOTE — ED Notes (Signed)
Patient transported to X-ray 

## 2017-08-18 NOTE — ED Notes (Signed)
Pt reports nausea with hour long treatment. New order obtained.

## 2017-08-18 NOTE — ED Provider Notes (Signed)
[facility]   CSN: [id number] Arrival date & time: 08/18/17  1117     History   Chief Complaint Chief Complaint  Patient presents with  . Shortness of Breath    HPI Laura [name] is a 26 y.o. female.  HPI Patient presents with shortness of breath and cough. Began yesterday evening. States it feels different from her normal asthma exacerbation. Has had a cough with some mild sputum production. Has had some chills but not frank fever. No relief with her inhalers. Sharp left-sided chest pain with it. No swelling in her legs. Has asthma but states she only has to get seen about once a year. Patient's daughter also presents with cough fever and earache. Patient denies possibility of pregnancy. Past Medical History:  Diagnosis Date  . Asthma     There are no active problems to display for this patient.   History reviewed. No pertinent surgical history.  OB History    Gravida Para Term Preterm AB Living   1             SAB TAB Ectopic Multiple Live Births                   Home Medications    Prior to Admission medications   Medication Sig Start Date End Date Taking? Authorizing Provider  predniSONE (DELTASONE) 10 MG tablet Take 4 tablets (40 mg total) by mouth daily. 10/07/15  Yes Barrett, Rolm Gala, PA-C  Prenatal Multivit-Min-Fe-FA (PRENATAL VITAMINS) 0.8 MG tablet Take 1 tablet by mouth daily.   Yes [provider]  albuterol (PROVENTIL HFA;VENTOLIN HFA) 108 (90 BASE) MCG/ACT inhaler Inhale 1-2 puffs into the lungs every 6 (six) hours as needed for wheezing or shortness of breath. 10/07/15   Barrett, Rolm Gala, PA-C  ipratropium-albuterol (DUONEB) 0.5-2.5 (3) MG/3ML SOLN Take 3 mLs by nebulization every 2 (two) hours as needed. 10/07/15   Barrett, Rolm Gala, PA-C    Family History No family history on file.  Social History Social History  Substance Use Topics  . Smoking status: Never Smoker  . Smokeless tobacco: Not on file  . Alcohol  use No     Allergies   Patient has no known allergies.   Review of Systems Review of Systems  Constitutional: Negative for appetite change.  HENT: Negative for congestion.   Respiratory: Positive for cough and shortness of breath.   Cardiovascular: Positive for chest pain.  Gastrointestinal: Negative for abdominal pain.  Genitourinary: Negative for flank pain.  Musculoskeletal: Negative for back pain.  Neurological: Negative for seizures.  Hematological: Negative for adenopathy.  Psychiatric/Behavioral: Negative for confusion.     Physical Exam Updated Vital Signs BP (!) 114/50   Pulse (!) 138   Temp 100.2 F (37.9 C) (Rectal)   Resp (!) 30   Ht  (1.626 m)   Wt 80.3 kg (177 lb)   LMP 06/29/2017   SpO2 96%   Breastfeeding? Unknown   BMI 30.38 kg/m   Physical Exam  Constitutional: She appears well-developed.  HENT:  Head: Normocephalic.  Eyes: Pupils are equal, round, and reactive to light.  Neck: Neck supple.  Cardiovascular:  Tachycardia  Pulmonary/Chest: She exhibits tenderness.  Mild left-sided chest tenderness. Some prolonged expirations and wheezes. No focal rales or rhonchi.  Abdominal: Soft. There is no tenderness.  Musculoskeletal: She exhibits no edema.  Neurological: She is alert.  Skin: Skin is warm. Capillary refill takes less than 2 seconds.  Psychiatric: She has a normal  mood and affect.     ED Treatments / Results  Labs (all labs ordered are listed, but only abnormal results are displayed) Labs Reviewed  CBC WITH DIFFERENTIAL/PLATELET - Abnormal; Notable for the following:       Result Value   RBC 5.25 (*)    MCV 69.0 (*)    MCH 22.9 (*)    Lymphs Abs 0.4 (*)    All other components within normal limits  BASIC METABOLIC PANEL - Abnormal; Notable for the following:    Potassium 2.9 (*)    CO2 19 (*)    Glucose, Bld 106 (*)    All other components within normal limits  D-DIMER, QUANTITATIVE (NOT AT [facility])    EKG  EKG  Interpretation  Date/Time:  Monday August 18 2017 12:09:51 EDT Ventricular Rate:  134 PR Interval:    QRS Duration: 92 QT Interval:  312 QTC Calculation: 466 R Axis:   85 Text Interpretation:  Sinus tachycardia Consider right atrial enlargement Borderline Q waves in inferior leads Borderline ST depression, diffuse leads Confirmed by Benjiman Core [id number]) on 08/18/2017 12:32:48 PM       Radiology Dg Chest 2 View  Result Date: 08/18/2017 CLINICAL DATA:  Shortness of breath.  Chest pain and tightness EXAM: CHEST  2 VIEW COMPARISON:  None available FINDINGS: Mildly low lung volumes with interstitial crowding. There is no edema, consolidation, effusion, or pneumothorax. Normal heart size and mediastinal contours. IMPRESSION: Negative low volume chest. Electronically Signed   By: Marnee Spring M.D.   On: 08/18/2017 12:32    Procedures Procedures (including critical care time)  Medications Ordered in ED Medications  magnesium sulfate IVPB 2 g 50 mL (2 g Intravenous New Bag/Given 08/18/17 1433)  albuterol (PROVENTIL) (2.5 MG/3ML) 0.083% nebulizer solution (5 mg  Given 08/18/17 1134)  sodium chloride 0.9 % bolus 500 mL (0 mLs Intravenous Stopped 08/18/17 1259)  methylPREDNISolone sodium succinate (SOLU-MEDROL) 125 mg/2 mL injection 125 mg (125 mg Intravenous Given 08/18/17 1259)  sodium chloride 0.9 % bolus 500 mL (0 mLs Intravenous Stopped 08/18/17 1445)  albuterol (PROVENTIL,VENTOLIN) solution continuous neb (10 mg/hr Nebulization Given 08/18/17 1340)  ondansetron (ZOFRAN) injection 4 mg (4 mg Intravenous Given 08/18/17 1433)     Initial Impression / Assessment and Plan / ED Course  I have reviewed the triage vital signs and the nursing notes.  Pertinent labs & imaging results that were available during my care of the patient were reviewed by me and considered in my medical decision making (see chart for details).     Patient presents with shortness of breath. Since yesterday.  Daughter has URI. X-ray reassuring and negative d-dimer however patient continues to be dyspneic. Had tachycardia that has also been persistent. Still wheezes. Has had 3 breathing treatments including an hour-long treatment. Sats will dip to 89% on room air. Will require admission to hospital. Has had magnesium also.  CRITICAL CARE Performed by: Billee Cashing Total critical care time: 30 minutes Critical care time was exclusive of separately billable procedures and treating other patients. Critical care was necessary to treat or prevent imminent or life-threatening deterioration. Critical care was time spent personally by me on the following activities: development of treatment plan with patient and/or surrogate as well as nursing, discussions with consultants, evaluation of patient's response to treatment, examination of patient, obtaining history from patient or surrogate, ordering and performing treatments and interventions, ordering and review of laboratory studies, ordering and review of radiographic studies, pulse oximetry and re-evaluation of  patient's condition.   Final Clinical Impressions(s) / ED Diagnoses   Final diagnoses:  Asthma with status asthmaticus, unspecified asthma severity, unspecified whether persistent    New Prescriptions New Prescriptions   No medications on file     Benjiman Core, MD 08/18/17 1505

## 2017-08-19 DIAGNOSIS — J4551 Severe persistent asthma with (acute) exacerbation: Secondary | ICD-10-CM

## 2017-08-19 DIAGNOSIS — E876 Hypokalemia: Secondary | ICD-10-CM | POA: Diagnosis present

## 2017-08-19 LAB — PREGNANCY, URINE: Preg Test, Ur: NEGATIVE

## 2017-08-19 LAB — CBC
HCT: 36.2 % (ref 36.0–46.0)
Hemoglobin: 12 g/dL (ref 12.0–15.0)
MCH: 23.2 pg — ABNORMAL LOW (ref 26.0–34.0)
MCHC: 33.1 g/dL (ref 30.0–36.0)
MCV: 70 fL — ABNORMAL LOW (ref 78.0–100.0)
PLATELETS: 336 10*3/uL (ref 150–400)
RBC: 5.17 MIL/uL — ABNORMAL HIGH (ref 3.87–5.11)
RDW: 15 % (ref 11.5–15.5)
WBC: 5.8 10*3/uL (ref 4.0–10.5)

## 2017-08-19 LAB — RAPID URINE DRUG SCREEN, HOSP PERFORMED
AMPHETAMINES: NOT DETECTED
Barbiturates: NOT DETECTED
Benzodiazepines: NOT DETECTED
Cocaine: NOT DETECTED
Opiates: NOT DETECTED
TETRAHYDROCANNABINOL: NOT DETECTED

## 2017-08-19 LAB — BASIC METABOLIC PANEL
Anion gap: 6 (ref 5–15)
BUN: 6 mg/dL (ref 6–20)
CO2: 22 mmol/L (ref 22–32)
CREATININE: 0.68 mg/dL (ref 0.44–1.00)
Calcium: 9.3 mg/dL (ref 8.9–10.3)
Chloride: 113 mmol/L — ABNORMAL HIGH (ref 101–111)
GFR calc Af Amer: >60 mL/min (ref >60)
Glucose, Bld: 130 mg/dL — ABNORMAL HIGH (ref 65–99)
Potassium: 4.3 mmol/L (ref 3.5–5.1)
SODIUM: 141 mmol/L (ref 135–145)

## 2017-08-19 LAB — MAGNESIUM: MAGNESIUM: 2.4 mg/dL (ref 1.7–2.4)

## 2017-08-19 LAB — HIV ANTIBODY (ROUTINE TESTING W REFLEX): HIV SCREEN 4TH GENERATION: NONREACTIVE

## 2017-08-19 MED ORDER — IPRATROPIUM-ALBUTEROL 0.5-2.5 (3) MG/3ML IN SOLN
3.0000 mL | Freq: Three times a day (TID) | RESPIRATORY_TRACT | Status: DC
  Administered 2017-08-19 – 2017-08-20 (×2): 3 mL via RESPIRATORY_TRACT
  Filled 2017-08-19 (×2): qty 3

## 2017-08-19 NOTE — Progress Notes (Signed)
PROGRESS NOTE    Laura [name]  ZOX:[id number] DOB: [birth year] DOA: 08/18/2017 PCP: Patient, No Pcp Per     Brief Narrative:  Laura [name] is a 26 y.o. female with medical history significant of asthma presenting as a transfer from [facility] due to asthma attack. She states that her typical asthma attacks are relieved by home neb; if it persists, she gets treatment in ED with improvement. This episode however, she has had no relief even with 3 treatments in the ED. She admits that her daughter recently recovered from URI. She was admitted to [facility] for further treatment of asthma exacerbation.   Assessment & Plan:   Principal Problem:   Asthma exacerbation Active Problems:   Hypokalemia   Asthma exacerbation -Improved today, but still with exertional shortness of breath and wheezes. On room air this morning -Continue nebulizer, Solu-Medrol  Hypokalemia -Resolved   DVT prophylaxis: Low risk, recommend early ambulation Code Status: Full code Family Communication: No family at bedside Disposition Plan: Pending improvement, likely home tomorrow 10/3   Consultants:   None  Procedures:   None  Antimicrobials:  Anti-infectives    None        Subjective: Patient states that she feels better at rest. However, gets very short of breath with ambulation to the bathroom, continues to wheeze. Does not feel like she has improved enough to go home today.  Objective: Vitals:   08/18/17 2138 08/19/17 0300 08/19/17 0650 08/19/17 0738  BP: 125/81  108/72   Pulse: (!) 123  93   Resp: 18  16   Temp: 98.4 F (36.9 C)  98.3 F (36.8 C)   TempSrc: Oral  Oral   SpO2:  97% 95% 93%  Weight:      Height:        Intake/Output Summary (Last 24 hours) at 08/19/17 1143 Last data filed at 08/18/17 1559  Gross per 24 hour  Intake             1050 ml  Output                0 ml  Net             1050 ml   Filed Weights   08/18/17 1124 08/18/17 1815  Weight: 80.3  kg (177 lb) 81.8 kg (180 lb 5.4 oz)    Examination:  General exam: Appears calm and comfortable  Respiratory system: Clear to auscultation with wheezes. Respiratory effort normal. Cardiovascular system: S1 & S2 heard, RRR. No JVD, murmurs, rubs, gallops or clicks. No pedal edema. Gastrointestinal system: Abdomen is nondistended, soft and nontender. No organomegaly or masses felt. Normal bowel sounds heard. Central nervous system: Alert and oriented. No focal neurological deficits. Extremities: Symmetric 5 x 5 power. Skin: No rashes, lesions or ulcers Psychiatry: Judgement and insight appear normal. Mood & affect appropriate.   Data Reviewed: I have personally reviewed following labs and imaging studies  CBC:  Recent Labs Lab 08/18/17 1212 08/19/17 0212  WBC 6.7 5.8  NEUTROABS 5.8  --   HGB 12.0 12.0  HCT 36.2 36.2  MCV 69.0* 70.0*  PLT 359 336   Basic Metabolic Panel:  Recent Labs Lab 08/18/17 1212 08/19/17 0212  NA 137 141  K 2.9* 4.3  CL 109 113*  CO2 19* 22  GLUCOSE 106* 130*  BUN 6 6  CREATININE 0.75 0.68  CALCIUM 9.0 9.3  MG  --  2.4   GFR: Estimated Creatinine Clearance: 112.1  mL/min (by C-G formula based on SCr of 0.68 mg/dL). Liver Function Tests: No results for input(s): AST, ALT, ALKPHOS, BILITOT, PROT, ALBUMIN in the last 168 hours. No results for input(s): LIPASE, AMYLASE in the last 168 hours. No results for input(s): AMMONIA in the last 168 hours. Coagulation Profile: No results for input(s): INR, PROTIME in the last 168 hours. Cardiac Enzymes: No results for input(s): CKTOTAL, CKMB, CKMBINDEX, TROPONINI in the last 168 hours. BNP (last 3 results) No results for input(s): PROBNP in the last 8760 hours. HbA1C: No results for input(s): HGBA1C in the last 72 hours. CBG: No results for input(s): GLUCAP in the last 168 hours. Lipid Profile: No results for input(s): CHOL, HDL, LDLCALC, TRIG, CHOLHDL, LDLDIRECT in the last 72 hours. Thyroid  Function Tests: No results for input(s): TSH, T4TOTAL, FREET4, T3FREE, THYROIDAB in the last 72 hours. Anemia Panel: No results for input(s): VITAMINB12, FOLATE, FERRITIN, TIBC, IRON, RETICCTPCT in the last 72 hours. Sepsis Labs: No results for input(s): PROCALCITON, LATICACIDVEN in the last 168 hours.  No results found for this or any previous visit (from the past 240 hour(s)).     Radiology Studies: Dg Chest 2 View  Result Date: 08/18/2017 CLINICAL DATA:  Shortness of breath.  Chest pain and tightness EXAM: CHEST  2 VIEW COMPARISON:  None available FINDINGS: Mildly low lung volumes with interstitial crowding. There is no edema, consolidation, effusion, or pneumothorax. Normal heart size and mediastinal contours. IMPRESSION: Negative low volume chest. Electronically Signed   By: Marnee Spring M.D.   On: 08/18/2017 12:32      Scheduled Meds: . ipratropium-albuterol  3 mL Nebulization Q6H  . methylPREDNISolone (SOLU-MEDROL) injection  60 mg Intravenous Q12H   Continuous Infusions:   LOS: 1 day    Time spent: 30 minutes   Noralee Stain, DO [facility] [email or web address] Password [email or web address] 08/19/2017, 11:43 AM

## 2017-08-20 MED ORDER — IPRATROPIUM-ALBUTEROL 0.5-2.5 (3) MG/3ML IN SOLN: 3.0000 mL | Freq: Two times a day (BID) | RESPIRATORY_TRACT | Status: DC

## 2017-08-20 MED ORDER — IPRATROPIUM-ALBUTEROL 0.5-2.5 (3) MG/3ML IN SOLN: 3.0000 mL | Freq: Four times a day (QID) | RESPIRATORY_TRACT | 3 refills | Status: DC | PRN

## 2017-08-20 MED ORDER — ALBUTEROL SULFATE HFA 108 (90 BASE) MCG/ACT IN AERS: 1.0000 | INHALATION_SPRAY | Freq: Four times a day (QID) | RESPIRATORY_TRACT | 3 refills | Status: DC | PRN

## 2017-08-20 MED ORDER — PREDNISONE 10 MG PO TABS: ORAL_TABLET | ORAL | 0 refills | Status: DC

## 2017-08-20 NOTE — Discharge Summary (Signed)
Physician Discharge Summary  Laura [name]:[id number] DOB: May 13, 1991 DOA: 08/18/2017  PCP: Patient, No Pcp Per  Admit date: 08/18/2017 Discharge date: 08/20/2017  Recommendations for Outpatient Follow-up:  1. Pt will need to follow up with PCP in 2-3 weeks post discharge 2. Please obtain BMP to evaluate electrolytes and kidney function  Discharge Diagnoses:  Principal Problem:   Asthma exacerbation Active Problems:   Hypokalemia   Discharge Condition: Stable  Diet recommendation: Heart healthy diet discussed in details   History of present illness:    Brief Narrative:  26 y.o.femalewith medical history significant of asthma presenting as a transfer from [facility] due to asthma attack. Symptoms started 1-2 days prior to this admission, progressively worsening dyspnea on exertion and eventually at rest, associated with fatigue and wheezing.   Assessment & Plan:  Asthma exacerbation - has been treated with steroids, BD's scheduled and as needed - improved and wants to be discharged today  - pt is clinically stable but still with mild exp wheezing noted  - advised to cont BD's per home medical regimen and prednisone taper   Hypokalemia - supplemented    DVT prophylaxis: Low risk, recommend early ambulation Code Status: Full code Family Communication: No family at bedside Disposition Plan: Pending improvement, likely home tomorrow 10/3   Consultants:   None  Procedures:   None  Procedures/Studies: Dg Chest 2 View  Result Date: 08/18/2017 CLINICAL DATA:  Shortness of breath.  Chest pain and tightness EXAM: CHEST  2 VIEW COMPARISON:  None available FINDINGS: Mildly low lung volumes with interstitial crowding. There is no edema, consolidation, effusion, or pneumothorax. Normal heart size and mediastinal contours. IMPRESSION: Negative low volume chest. Electronically Signed   By: Marnee Spring M.D.   On: 08/18/2017 12:32     Discharge Exam: Vitals:    08/20/17 0505 08/20/17 0814  BP: 128/78   Pulse: 95   Resp: 18   Temp: 98.9 F (37.2 C)   SpO2: 95% 95%   Vitals:   08/19/17 2058 08/19/17 2124 08/20/17 0505 08/20/17 0814  BP: 116/68  128/78   Pulse: 87  95   Resp: 18  18   Temp: 98.7 F (37.1 C)  98.9 F (37.2 C)   TempSrc: Oral  Oral   SpO2: 97% 94% 95% 95%  Weight:      Height:        General: Pt is alert, follows commands appropriately, not in acute distress Cardiovascular: Regular rate and rhythm, S1/S2 +, no murmurs, no rubs, no gallops Respiratory: Clear to auscultation bilaterally, mild exp wheezing noted Abdominal: Soft, non tender, non distended, bowel sounds +, no guarding Extremities: no edema, no cyanosis, pulses palpable bilaterally DP and PT Neuro: Grossly nonfocal  Discharge Instructions  Discharge Instructions    Diet - low sodium heart healthy    Complete by:  As directed    Increase activity slowly    Complete by:  As directed      Allergies as of 08/20/2017   No Known Allergies     Medication List    TAKE these medications   albuterol 108 (90 Base) MCG/ACT inhaler Commonly known as:  PROVENTIL HFA;VENTOLIN HFA Inhale 1-2 puffs into the lungs every 6 (six) hours as needed for wheezing or shortness of breath.   FLOVENT HFA 110 MCG/ACT inhaler Generic drug:  fluticasone INL 2 PUFFS PO BID PRN FOR SOB AND WHEEZING   FLU/SEVERE COLD/COUGH NIGHT PO Take 1 packet by mouth daily as needed (  cold symptoms).   ipratropium-albuterol 0.5-2.5 (3) MG/3ML Soln Commonly known as:  DUONEB Take 3 mLs by nebulization every 6 (six) hours as needed (sob and wheezing).   predniSONE 10 MG tablet Commonly known as:  DELTASONE Take 40 mg tablet starting 08/21/2017 and taper down by 10 mg daily until completed/. What changed:  how much to take  how to take this  when to take this  additional instructions       The results of significant diagnostics from this hospitalization (including imaging,  microbiology, ancillary and laboratory) are listed below for reference.     Labs: Basic Metabolic Panel:  Recent Labs Lab 08/18/17 1212 08/19/17 0212  NA 137 141  K 2.9* 4.3  CL 109 113*  CO2 19* 22  GLUCOSE 106* 130*  BUN 6 6  CREATININE 0.75 0.68  CALCIUM 9.0 9.3  MG  --  2.4   CBC:  Recent Labs Lab 08/18/17 1212 08/19/17 0212  WBC 6.7 5.8  NEUTROABS 5.8  --   HGB 12.0 12.0  HCT 36.2 36.2  MCV 69.0* 70.0*  PLT 359 336    SIGNED: Time coordinating discharge: 50 minutes  Debbora Presto, MD  [facility] 08/20/2017, 12:14 PM Pager [phone]  If 7PM-7AM, please contact night-coverage [email or web address] Password [email or web address]

## 2017-08-20 NOTE — Discharge Instructions (Signed)

## 2017-08-20 NOTE — Care Management Note (Signed)
Case Management Note  Patient Details  Name: Laura [name] MRN: [id number] Date of Birth: 05/16/1991  Subjective/Objective:       Pt admitted with Asthma             Action/Plan: Home with no needs   Expected Discharge Date:  08/20/17               Expected Discharge Plan:  Home/Self Care  In-House Referral:     Discharge planning Services  CM Consult  Post Acute Care Choice:    Choice offered to:     DME Arranged:    DME Agency:     HH Arranged:    HH Agency:     Status of Service:  Completed, signed off  If discussed at [employer] of Stay Meetings, dates discussed:    Additional CommentsGeni Bers, RN 08/20/2017, 1:19 PM

## 2017-10-08 ENCOUNTER — Other Ambulatory Visit: Payer: Self-pay

## 2017-10-08 ENCOUNTER — Emergency Department (HOSPITAL_BASED_OUTPATIENT_CLINIC_OR_DEPARTMENT_OTHER): Payer: Medicaid Other

## 2017-10-08 ENCOUNTER — Encounter (HOSPITAL_BASED_OUTPATIENT_CLINIC_OR_DEPARTMENT_OTHER): Payer: Self-pay | Admitting: Emergency Medicine

## 2017-10-08 ENCOUNTER — Emergency Department (HOSPITAL_BASED_OUTPATIENT_CLINIC_OR_DEPARTMENT_OTHER)
Admission: EM | Admit: 2017-10-08 | Discharge: 2017-10-08 | Disposition: A | Discharge status: Home / Self Care | Payer: Medicaid Other | Attending: Emergency Medicine | Admitting: Emergency Medicine

## 2017-10-08 DIAGNOSIS — J189 Pneumonia, unspecified organism: Secondary | ICD-10-CM | POA: Diagnosis not present

## 2017-10-08 DIAGNOSIS — R0981 Nasal congestion: Secondary | ICD-10-CM | POA: Diagnosis present

## 2017-10-08 DIAGNOSIS — J45909 Unspecified asthma, uncomplicated: Secondary | ICD-10-CM | POA: Insufficient documentation

## 2017-10-08 LAB — URINALYSIS, ROUTINE W REFLEX MICROSCOPIC
GLUCOSE, UA: NEGATIVE mg/dL
Ketones, ur: >80 mg/dL — AB
Leukocytes, UA: NEGATIVE
Nitrite: NEGATIVE
PH: 7 (ref 5.0–8.0)
Protein, ur: 30 mg/dL — AB
SPECIFIC GRAVITY, URINE: 1.015 (ref 1.005–1.030)

## 2017-10-08 LAB — CBC WITH DIFFERENTIAL/PLATELET
BASOS PCT: 0 %
Basophils Absolute: 0 10*3/uL (ref 0.0–0.1)
EOS PCT: 2 %
Eosinophils Absolute: 0.2 10*3/uL (ref 0.0–0.7)
HEMATOCRIT: 36.5 % (ref 36.0–46.0)
HEMOGLOBIN: 12.1 g/dL (ref 12.0–15.0)
Lymphocytes Relative: 6 %
Lymphs Abs: 0.6 10*3/uL — ABNORMAL LOW (ref 0.7–4.0)
MCH: 22.9 pg — ABNORMAL LOW (ref 26.0–34.0)
MCHC: 33.2 g/dL (ref 30.0–36.0)
MCV: 69.1 fL — AB (ref 78.0–100.0)
MONO ABS: 0.5 10*3/uL (ref 0.1–1.0)
MONOS PCT: 5 %
NEUTROS PCT: 87 %
Neutro Abs: 9.4 10*3/uL — ABNORMAL HIGH (ref 1.7–7.7)
Platelets: 323 10*3/uL (ref 150–400)
RBC: 5.28 MIL/uL — ABNORMAL HIGH (ref 3.87–5.11)
RDW: 15.5 % (ref 11.5–15.5)
WBC: 10.7 10*3/uL — ABNORMAL HIGH (ref 4.0–10.5)

## 2017-10-08 LAB — COMPREHENSIVE METABOLIC PANEL
ALK PHOS: 53 U/L (ref 38–126)
ALT: 13 U/L — AB (ref 14–54)
AST: 17 U/L (ref 15–41)
Albumin: 4 g/dL (ref 3.5–5.0)
Anion gap: 8 (ref 5–15)
BILIRUBIN TOTAL: 0.7 mg/dL (ref 0.3–1.2)
BUN: 6 mg/dL (ref 6–20)
CO2: 22 mmol/L (ref 22–32)
CREATININE: 0.72 mg/dL (ref 0.44–1.00)
Calcium: 8.9 mg/dL (ref 8.9–10.3)
Chloride: 107 mmol/L (ref 101–111)
GFR calc Af Amer: >60 mL/min (ref >60)
GLUCOSE: 94 mg/dL (ref 65–99)
Potassium: 2.9 mmol/L — ABNORMAL LOW (ref 3.5–5.1)
Sodium: 137 mmol/L (ref 135–145)
Total Protein: 7 g/dL (ref 6.5–8.1)

## 2017-10-08 LAB — URINALYSIS, MICROSCOPIC (REFLEX): WBC UA: NONE SEEN WBC/hpf (ref 0–5)

## 2017-10-08 LAB — PREGNANCY, URINE: Preg Test, Ur: NEGATIVE

## 2017-10-08 LAB — I-STAT CG4 LACTIC ACID, ED: Lactic Acid, Venous: 0.69 mmol/L (ref 0.5–1.9)

## 2017-10-08 MED ORDER — KETOROLAC TROMETHAMINE 30 MG/ML IJ SOLN
30.0000 mg | Freq: Once | INTRAMUSCULAR | Status: AC
  Administered 2017-10-08: 30 mg via INTRAVENOUS
  Filled 2017-10-08: qty 1

## 2017-10-08 MED ORDER — IPRATROPIUM BROMIDE 0.02 % IN SOLN
0.5000 mg | Freq: Once | RESPIRATORY_TRACT | Status: AC
  Administered 2017-10-08: 0.5 mg via RESPIRATORY_TRACT
  Filled 2017-10-08: qty 2.5

## 2017-10-08 MED ORDER — ALBUTEROL SULFATE (2.5 MG/3ML) 0.083% IN NEBU
5.0000 mg | INHALATION_SOLUTION | Freq: Once | RESPIRATORY_TRACT | Status: AC
  Administered 2017-10-08: 5 mg via RESPIRATORY_TRACT
  Filled 2017-10-08: qty 6

## 2017-10-08 MED ORDER — SODIUM CHLORIDE 0.9 % IV BOLUS (SEPSIS)
1000.0000 mL | Freq: Once | INTRAVENOUS | Status: AC
  Administered 2017-10-08: 1000 mL via INTRAVENOUS

## 2017-10-08 MED ORDER — CEPHALEXIN 500 MG PO CAPS
500.0000 mg | ORAL_CAPSULE | Freq: Three times a day (TID) | ORAL | 0 refills | Status: DC
Start: 2017-10-08 — End: 2018-06-16

## 2017-10-08 MED ORDER — ACETAMINOPHEN 500 MG PO TABS
1000.0000 mg | ORAL_TABLET | Freq: Once | ORAL | Status: AC
Start: 2017-10-08 — End: 2017-10-08
  Administered 2017-10-08: 1000 mg via ORAL
  Filled 2017-10-08: qty 2

## 2017-10-08 MED ORDER — AZITHROMYCIN 250 MG PO TABS: 250.0000 mg | ORAL_TABLET | Freq: Every day | ORAL | 0 refills | Status: DC

## 2017-10-08 MED ORDER — ALBUTEROL SULFATE HFA 108 (90 BASE) MCG/ACT IN AERS
2.0000 | INHALATION_SPRAY | Freq: Once | RESPIRATORY_TRACT | Status: AC
  Administered 2017-10-08: 2 via RESPIRATORY_TRACT
  Filled 2017-10-08: qty 6.7

## 2017-10-08 MED ORDER — DEXTROSE 5 % IV SOLN
1.0000 g | Freq: Once | INTRAVENOUS | Status: AC
  Administered 2017-10-08: 1 g via INTRAVENOUS
  Filled 2017-10-08: qty 10

## 2017-10-08 MED ORDER — POTASSIUM CHLORIDE CRYS ER 20 MEQ PO TBCR
40.0000 meq | EXTENDED_RELEASE_TABLET | Freq: Once | ORAL | Status: AC
  Administered 2017-10-08: 40 meq via ORAL
  Filled 2017-10-08: qty 2

## 2017-10-08 MED FILL — AZITHROMYCIN 250 MG TABLET: 250 | 5 days supply | Qty: 6 | Fill #0

## 2017-10-08 MED FILL — CEPHALEXIN 500 MG CAPSULE: 500 | 7 days supply | Qty: 21 | Fill #0

## 2017-10-08 NOTE — ED Triage Notes (Signed)
Nasal congestions, wheezing and cough for several days.  Pt concerned about possible mold exposure.

## 2017-10-08 NOTE — ED Provider Notes (Signed)
[facility] EMERGENCY DEPARTMENT Provider Note   CSN: [id number] Arrival date & time: 10/08/17  1001     History   Chief Complaint Chief Complaint  Patient presents with  . Nasal Congestion    HPI Laura [name] is a 26 y.o. female history of asthma presenting with cough, congestion, chills, subjective fever.  Patient states that she has been having sinus congestion for the last 2-3 days.  She denies any purulent drainage from her nose.  She has been having productive cough with yellowish sputum for the last several days as well.  She also has chills and subjective fever but did not take her temperature.  She denies any vomiting or urinary or abdominal pain.  She states that her daughter is sick with similar symptoms and there may be some mold in the house.  Patient has been using her albuterol inhalers with minimal relief.  The history is provided by the patient.    Past Medical History:  Diagnosis Date  . Asthma     Patient Active Problem List   Diagnosis Date Noted  . Hypokalemia 08/19/2017  . Asthma exacerbation 08/18/2017    No past surgical history on file.  OB History    Gravida Para Term Preterm AB Living   1             SAB TAB Ectopic Multiple Live Births                   Home Medications    Prior to Admission medications   Medication Sig Start Date End Date Taking? Authorizing Provider  albuterol (PROVENTIL HFA;VENTOLIN HFA) 108 (90 Base) MCG/ACT inhaler Inhale 1-2 puffs into the lungs every 6 (six) hours as needed for wheezing or shortness of breath. 08/20/17   Dorothea OgleMyers, Iskra M, MD  FLOVENT HFA 110 MCG/ACT inhaler INL 2 PUFFS PO BID PRN FOR SOB AND WHEEZING 06/24/17   [provider]  ipratropium-albuterol (DUONEB) 0.5-2.5 (3) MG/3ML SOLN Take 3 mLs by nebulization every 6 (six) hours as needed (sob and wheezing). 08/20/17   Dorothea OgleMyers, Iskra M, MD    Family History Family History  Problem Relation Age of Onset  . Asthma Father   .  Asthma Maternal Grandmother     Social History Social History   Tobacco Use  . Smoking status: Never Smoker  . Smokeless tobacco: Never Used  Substance Use Topics  . Alcohol use: No  . Drug use: No     Allergies   Patient has no known allergies.   Review of Systems Review of Systems  Constitutional: Positive for chills and fever.  Respiratory: Positive for cough.   Neurological: Positive for weakness.  All other systems reviewed and are negative.    Physical Exam Updated Vital Signs BP 140/80   Pulse (!) 126   Temp (!) 101.7 F (38.7 C) (Oral)   Resp 20   Ht 5\' 4"  (1.626 m)   Wt 80.7 kg (178 lb)   LMP 09/27/2017   SpO2 96%   Breastfeeding? Yes   BMI 30.55 kg/m   Physical Exam  Constitutional: She is oriented to person, place, and time.  Uncomfortable, dehydrated   HENT:  Head: Normocephalic.  MM slightly dry, OP clear and not red   Eyes: Conjunctivae and EOM are normal. Pupils are equal, round, and reactive to light.  Neck: Normal range of motion. Neck supple.  Cardiovascular: Normal rate, regular rhythm and normal heart sounds.  Pulmonary/Chest:  Tachypneic, crackles  L base, minimal wheezing throughout   Abdominal: Soft. Bowel sounds are normal. She exhibits no distension. There is no tenderness. There is no guarding.  Musculoskeletal: Normal range of motion.  Neurological: She is alert and oriented to person, place, and time. No cranial nerve deficit. Coordination normal.  Skin: Skin is warm.  Psychiatric: She has a normal mood and affect.  Nursing note and vitals reviewed.    ED Treatments / Results  Labs (all labs ordered are listed, but only abnormal results are displayed) Labs Reviewed  CBC WITH DIFFERENTIAL/PLATELET  COMPREHENSIVE METABOLIC PANEL  URINALYSIS, ROUTINE W REFLEX MICROSCOPIC  PREGNANCY, URINE  I-STAT CG4 LACTIC ACID, ED    EKG  EKG Interpretation None       Radiology No results found.  Procedures Procedures  (including critical care time)  Medications Ordered in ED Medications  sodium chloride 0.9 % bolus 1,000 mL (not administered)  acetaminophen (TYLENOL) tablet 1,000 mg (not administered)  albuterol (PROVENTIL) (2.5 MG/3ML) 0.083% nebulizer solution 5 mg (not administered)  ipratropium (ATROVENT) nebulizer solution 0.5 mg (not administered)     Initial Impression / Assessment and Plan / ED Course  I have reviewed the triage vital signs and the nursing notes.  Pertinent labs & imaging results that were available during my care of the patient were reviewed by me and considered in my medical decision making (see chart for details).     Laura [name] is a 26 y.o. female here with cough, congestion, fever. Febrile 101 in the ED, tachycardic to 120s. Likely pneumonia vs asthma exacerbation. Will get CBC, CMP, lactate, UA, CXR. Will hydrate and give nebs and reassess. She states that there might be mold in the house. I told her that here is no specific testing for mold and that it may cause her to have asthma exacerbation.   1:53 PM WBC 11. Lactate nl. CXR showed atelectasis with no obvious infiltrate. Given fever, tachycardia, clinically she has early pneumonia. She is breast feeding mainly at night so I ordered rocephin in the ED and will dc home with keflex, zpack. Her fever and tachycardia resolved in the ED. No wheezing after nebs, will not need steroids for now.    Final Clinical Impressions(s) / ED Diagnoses   Final diagnoses:  None    ED Discharge Orders    None       Charlynne PanderYao, Saw Mendenhall Hsienta, MD 10/08/17 1354

## 2017-10-08 NOTE — Discharge Instructions (Addendum)
Stay hydrated.   Take tylenol, motrin for fever.   Take keflex, azithromycin for pneumonia. Please wait 2 hrs then you can breast feed.   Use albuterol as needed for cough or wheezing   See your doctor in a week   Return to ER if you have worse shortness of breath, cough, fever, trouble breathing

## 2017-10-08 NOTE — ED Notes (Signed)
Dr. Silverio LayYao states it is not necessary to repeat lactic acid

## 2017-10-14 LAB — PATHOLOGIST SMEAR REVIEW

## 2017-12-21 ENCOUNTER — Emergency Department (HOSPITAL_BASED_OUTPATIENT_CLINIC_OR_DEPARTMENT_OTHER): Payer: Medicaid Other

## 2017-12-21 ENCOUNTER — Other Ambulatory Visit: Payer: Self-pay

## 2017-12-21 ENCOUNTER — Encounter (HOSPITAL_BASED_OUTPATIENT_CLINIC_OR_DEPARTMENT_OTHER): Payer: Self-pay | Admitting: *Deleted

## 2017-12-21 ENCOUNTER — Emergency Department (HOSPITAL_BASED_OUTPATIENT_CLINIC_OR_DEPARTMENT_OTHER)
Admission: EM | Admit: 2017-12-21 | Discharge: 2017-12-22 | Disposition: A | Discharge status: Home / Self Care | Payer: Medicaid Other | Attending: Emergency Medicine | Admitting: Emergency Medicine

## 2017-12-21 DIAGNOSIS — R05 Cough: Secondary | ICD-10-CM | POA: Diagnosis not present

## 2017-12-21 DIAGNOSIS — H9203 Otalgia, bilateral: Secondary | ICD-10-CM | POA: Diagnosis not present

## 2017-12-21 DIAGNOSIS — J45909 Unspecified asthma, uncomplicated: Secondary | ICD-10-CM | POA: Insufficient documentation

## 2017-12-21 DIAGNOSIS — R0981 Nasal congestion: Secondary | ICD-10-CM | POA: Diagnosis not present

## 2017-12-21 DIAGNOSIS — J069 Acute upper respiratory infection, unspecified: Secondary | ICD-10-CM | POA: Diagnosis not present

## 2017-12-21 DIAGNOSIS — J4521 Mild intermittent asthma with (acute) exacerbation: Secondary | ICD-10-CM | POA: Insufficient documentation

## 2017-12-21 DIAGNOSIS — R062 Wheezing: Secondary | ICD-10-CM | POA: Diagnosis present

## 2017-12-21 MED ORDER — IPRATROPIUM BROMIDE 0.02 % IN SOLN
0.5000 mg | Freq: Once | RESPIRATORY_TRACT | Status: AC
  Administered 2017-12-21: 0.5 mg via RESPIRATORY_TRACT
  Filled 2017-12-21: qty 2.5

## 2017-12-21 MED ORDER — PREDNISONE 50 MG PO TABS
60.0000 mg | ORAL_TABLET | Freq: Once | ORAL | Status: AC
  Administered 2017-12-21: 60 mg via ORAL
  Filled 2017-12-21: qty 1

## 2017-12-21 MED ORDER — ALBUTEROL SULFATE (2.5 MG/3ML) 0.083% IN NEBU
5.0000 mg | INHALATION_SOLUTION | Freq: Once | RESPIRATORY_TRACT | Status: AC
  Administered 2017-12-21: 5 mg via RESPIRATORY_TRACT
  Filled 2017-12-21: qty 6

## 2017-12-21 NOTE — ED Triage Notes (Signed)
Hx of asthma. Reports SOB x 4 hours. Also reports URI sx and decreased hearing in both ears since Friday

## 2017-12-21 NOTE — ED Triage Notes (Signed)
Pt assessed in triage by RT

## 2017-12-22 LAB — PREGNANCY, URINE: Preg Test, Ur: NEGATIVE

## 2017-12-22 MED ORDER — GUAIFENESIN ER 600 MG PO TB12: 1200.0000 mg | ORAL_TABLET | Freq: Two times a day (BID) | ORAL | 0 refills | Status: AC

## 2017-12-22 MED ORDER — PREDNISONE 10 MG PO TABS: ORAL_TABLET | ORAL | 0 refills | Status: AC

## 2017-12-22 MED ORDER — FLUTICASONE PROPIONATE 50 MCG/ACT NA SUSP: 1.0000 | Freq: Every day | NASAL | 1 refills | Status: DC

## 2017-12-22 MED ORDER — CETIRIZINE HCL 10 MG PO CAPS: 10.0000 mg | ORAL_CAPSULE | Freq: Every day | ORAL | 0 refills | Status: DC

## 2017-12-22 NOTE — Discharge Instructions (Signed)
Please read and follow all provided instructions.  Your diagnoses today include:  1. Upper respiratory tract infection, unspecified type   2. Mild intermittent asthma with exacerbation     Tests performed today include: TESTS Vital signs. See below for your results today.   Medications prescribed:   Take any prescribed medications only as directed.  You are prescribed prednisone, a steroid. This is a medication to help reduce inflammation in the lungs.  Common side effects include upset stomach/nausea. You may take this medicine with food if this occurs. Other side effects include restlessness, difficulty sleeping, and increased sweating. Call your healthcare provider if these do not resolve after finishing the medication.  This medicine may increase your blood sugar so additional careful monitoring is needed of blood sugar if you have diabetes. Call your healthcare provider for any signs/symtpoms of high blood sugar such as confusion, feeling sleepy, more thirst, more hunger, passing urine more often, flushing, fast breathing, or breath that smells like fruit.  Mucinex is to break up the secretions in your sinuses.  Zyrtec is helping her congestion.  Flonase is to help with congestion and eustachian tube dysfunction.  Home care instructions:  Follow any educational materials contained in this packet.  Follow-up instructions: Please follow-up with your primary care provider in the next 3 days for further evaluation of your symptoms and management of your asthma.  Return instructions:  Please return to the Emergency Department if you experience worsening symptoms. Please return with worsening wheezing, shortness of breath, or difficulty breathing. Return with persistent fever above 101F.  Please return if you have any other emergent concerns.  Additional Information:  Your vital signs today were: BP 128/81 (BP Location: Left Arm)    Pulse (!) 110    Temp 99.3 F (37.4 C)  (Oral)    Resp 20    Ht 5\' 4"  (1.626 m)    Wt 72.6 kg (160 lb)    LMP 11/29/2017    SpO2 99%    BMI 27.46 kg/m  If your blood pressure (BP) was elevated above 135/85 this visit, please have this repeated by your doctor within one month. --------------

## 2017-12-22 NOTE — ED Provider Notes (Addendum)
[facility] EMERGENCY DEPARTMENT Provider Note   CSN: [id number] Arrival date & time: 12/21/17  2152     History   Chief Complaint Chief Complaint  Patient presents with  . Asthma    HPI Laura [name] is a 27 y.o. female.  HPI  Patient is a 27 year old female with a history of asthma presenting for wheezing, congestion, and sensation of ear fullness.  Patient reports that her symptoms began 2-2.5 days ago.  Patient reports that she has had some purulent rhinorrhea and congestion with sensation of bilateral ear fullness right greater than left.  Patient reports that she has had some tactile fevers at home, but has not recorded one.  Patient has been taking ibuprofen as an antipyretic, however she has not had any in 12 hours.  Patient reports a dry cough with no hemoptysis.  Patient denies any nausea, vomiting, diarrhea, headaches, myalgias, or rash.  Patient typically takes Flovent daily as well as albuterol as a rescue inhaler.  Patient reports she has palpitations and tachycardia anytime she increases her albuterol treatments.  Patient denies any history of DVT/PE, recent immobilization, recent hospitalization, hemoptysis, lower extremity swelling, treatment of cancer, or estrogen use.   Past Medical History:  Diagnosis Date  . Asthma     Patient Active Problem List   Diagnosis Date Noted  . Hypokalemia 08/19/2017  . Asthma exacerbation 08/18/2017    History reviewed. No pertinent surgical history.  OB History    Gravida Para Term Preterm AB Living   1             SAB TAB Ectopic Multiple Live Births                   Home Medications    Prior to Admission medications   Medication Sig Start Date End Date Taking? Authorizing Provider  albuterol (PROVENTIL HFA;VENTOLIN HFA) 108 (90 Base) MCG/ACT inhaler Inhale 1-2 puffs into the lungs every 6 (six) hours as needed for wheezing or shortness of breath. 08/20/17  Yes Dorothea Ogle, MD  FLOVENT HFA 110 MCG/ACT  inhaler INL 2 PUFFS PO BID PRN FOR SOB AND WHEEZING 06/24/17  Yes [provider]  ipratropium-albuterol (DUONEB) 0.5-2.5 (3) MG/3ML SOLN Take 3 mLs by nebulization every 6 (six) hours as needed (sob and wheezing). 08/20/17  Yes Dorothea Ogle, MD  azithromycin (ZITHROMAX) 250 MG tablet Take 1 tablet (250 mg total) by mouth daily. Take first 2 tablets together, then 1 every day until finished. 10/08/17   Charlynne Pander, MD  cephALEXin (KEFLEX) 500 MG capsule Take 1 capsule (500 mg total) by mouth 3 (three) times daily. 10/08/17   Charlynne Pander, MD    Family History Family History  Problem Relation Age of Onset  . Asthma Father   . Asthma Maternal Grandmother     Social History Social History   Tobacco Use  . Smoking status: Never Smoker  . Smokeless tobacco: Never Used  Substance Use Topics  . Alcohol use: No  . Drug use: No     Allergies   Patient has no known allergies.   Review of Systems Review of Systems  Constitutional: Positive for chills. Negative for fever.  HENT: Positive for congestion, rhinorrhea and sinus pressure. Negative for sore throat.   Respiratory: Positive for cough and shortness of breath.   Cardiovascular: Negative for chest pain.  Gastrointestinal: Negative for abdominal pain, nausea and vomiting.  Genitourinary: Negative for dysuria.  Musculoskeletal: Negative for  myalgias.  Neurological: Negative for headaches.     Physical Exam Updated Vital Signs BP 128/81 (BP Location: Left Arm)   Pulse (!) 110   Temp 99.3 F (37.4 C) (Oral)   Resp 20   Ht 5\' 4"  (1.626 m)   Wt 72.6 kg (160 lb)   LMP 11/29/2017   SpO2 99%   BMI 27.46 kg/m   Physical Exam  Constitutional: She appears well-developed and well-nourished. No distress.  HENT:  Head: Normocephalic and atraumatic.  Mouth/Throat: Oropharynx is clear and moist.  Bilateral TMs with serous effusion but no evidence of separative effusion.  Loss of bony landmarks bilaterally.   No erythema or edema of external auditory canals bilaterally.  There is scarring of posterior TMs bilaterally.  Eyes: Conjunctivae and EOM are normal. Pupils are equal, round, and reactive to light.  Neck: Normal range of motion. Neck supple.  Cardiovascular: Regular rhythm, S1 normal and S2 normal.  No murmur heard. Tachycardia noted.  Pulmonary/Chest: Effort normal. She has wheezes. She has no rales.  Patient exhibits soft wheezes without prolonged expiratory phase and bilateral lower lung fields.  No rales.  Normal chest expansion without increased work of breathing.  Abdominal: Soft. She exhibits no distension. There is no tenderness. There is no guarding.  Musculoskeletal: Normal range of motion. She exhibits no edema or deformity.  Lymphadenopathy:    She has no cervical adenopathy.  Neurological: She is alert.  Cranial nerves grossly intact. Patient moves extremities symmetrically and with good coordination.  Skin: Skin is warm and dry. No rash noted. No erythema.  Psychiatric: She has a normal mood and affect. Her behavior is normal. Judgment and thought content normal.  Nursing note and vitals reviewed.    ED Treatments / Results  Labs (all labs ordered are listed, but only abnormal results are displayed) Labs Reviewed  PREGNANCY, URINE    EKG  EKG Interpretation None       Radiology Dg Chest 2 View  Result Date: 12/21/2017 CLINICAL DATA:  Fever cough and congestion EXAM: CHEST  2 VIEW COMPARISON:  10/08/2017 FINDINGS: The heart size and mediastinal contours are within normal limits. Both lungs are clear. The visualized skeletal structures are unremarkable. IMPRESSION: No active cardiopulmonary disease. Electronically Signed   By: Jasmine PangKim  Fujinaga M.D.   On: 12/21/2017 23:59    Procedures Procedures (including critical care time)  Medications Ordered in ED Medications  albuterol (PROVENTIL) (2.5 MG/3ML) 0.083% nebulizer solution 5 mg (5 mg Nebulization Given 12/21/17  2337)  ipratropium (ATROVENT) nebulizer solution 0.5 mg (0.5 mg Nebulization Given 12/21/17 2337)  predniSONE (DELTASONE) tablet 60 mg (60 mg Oral Given 12/21/17 2335)     Initial Impression / Assessment and Plan / ED Course  I have reviewed the triage vital signs and the nursing notes.  Pertinent labs & imaging results that were available during my care of the patient were reviewed by me and considered in my medical decision making (see chart for details).    Final Clinical Impressions(s) / ED Diagnoses   Final diagnoses:  Upper respiratory tract infection, unspecified type  Mild intermittent asthma with exacerbation   Patient is nontoxic-appearing, afebrile (without antipyretics), but tachycardic.  Patient reports that this tachycardia occurs every time she receives breathing treatments, and she has had to stay in the hospital before because tachycardia was difficult to resolve while she was continued to have breathing treatments.  Patient reports she feels well breathing treatment and does not feel that her tachycardia is different  than prior episodes when she is received breathing treatments.  The only point the patient receives on [facility] rules are for the tachycardia, but has no other risk factors for DVT/PE at this time.  Chest x-ray unremarkable for infiltrate.  Patient had one breathing treatment given first dose of prednisone in the emergency department today.  Will start patient on a prednisone taper, Flonase, Zyrtec, Mucinex, and close follow-up with primary care provider in a couple days.  Patient is breast-feeding and all of these medications were verified a safe via [employer].   Pulse rechecked by me.  Radial pulse is 104.  Discussed tachycardia with Dr. Baxter Hire Ward and this pulse is per patient's report of her typical reaction to albuterol.   Patient given return precautions for any shortness of breath, worsening wheezing, recurrent fevers, chest pain, syncope or presyncope,  dizziness or lightheadedness.  Patient is in understanding and agrees with the plan of care.   ED Discharge Orders    None       Elisha Ponder, PA-C 12/22/17 0151    Ward, Layla Maw, DO 12/22/17 0152    Aviva Kluver B, PA-C 12/22/17 0153    Ward, Layla Maw, DO 12/22/17 4098

## 2017-12-22 NOTE — ED Notes (Signed)
Pt discharged to home with family. NAD.  

## 2018-06-15 ENCOUNTER — Inpatient Hospital Stay (HOSPITAL_BASED_OUTPATIENT_CLINIC_OR_DEPARTMENT_OTHER)
Admission: EM | Admit: 2018-06-15 | Discharge: 2018-06-16 | DRG: 194 | Disposition: A | Discharge status: Home / Self Care | Payer: Medicaid Other | Attending: Family Medicine | Admitting: Family Medicine

## 2018-06-15 ENCOUNTER — Emergency Department (HOSPITAL_BASED_OUTPATIENT_CLINIC_OR_DEPARTMENT_OTHER): Payer: Medicaid Other

## 2018-06-15 ENCOUNTER — Encounter (HOSPITAL_BASED_OUTPATIENT_CLINIC_OR_DEPARTMENT_OTHER): Payer: Self-pay | Admitting: Adult Health

## 2018-06-15 ENCOUNTER — Other Ambulatory Visit: Payer: Self-pay

## 2018-06-15 DIAGNOSIS — Z79899 Other long term (current) drug therapy: Secondary | ICD-10-CM

## 2018-06-15 DIAGNOSIS — Z825 Family history of asthma and other chronic lower respiratory diseases: Secondary | ICD-10-CM

## 2018-06-15 DIAGNOSIS — J918 Pleural effusion in other conditions classified elsewhere: Secondary | ICD-10-CM | POA: Diagnosis present

## 2018-06-15 DIAGNOSIS — J9811 Atelectasis: Secondary | ICD-10-CM | POA: Diagnosis present

## 2018-06-15 DIAGNOSIS — J45901 Unspecified asthma with (acute) exacerbation: Secondary | ICD-10-CM | POA: Diagnosis present

## 2018-06-15 DIAGNOSIS — R000 Tachycardia, unspecified: Secondary | ICD-10-CM | POA: Diagnosis present

## 2018-06-15 DIAGNOSIS — R112 Nausea with vomiting, unspecified: Secondary | ICD-10-CM | POA: Diagnosis present

## 2018-06-15 DIAGNOSIS — E872 Acidosis, unspecified: Secondary | ICD-10-CM | POA: Diagnosis present

## 2018-06-15 DIAGNOSIS — J45909 Unspecified asthma, uncomplicated: Secondary | ICD-10-CM | POA: Diagnosis present

## 2018-06-15 DIAGNOSIS — E876 Hypokalemia: Secondary | ICD-10-CM | POA: Diagnosis present

## 2018-06-15 DIAGNOSIS — J181 Lobar pneumonia, unspecified organism: Secondary | ICD-10-CM

## 2018-06-15 DIAGNOSIS — J189 Pneumonia, unspecified organism: Secondary | ICD-10-CM | POA: Diagnosis present

## 2018-06-15 DIAGNOSIS — J90 Pleural effusion, not elsewhere classified: Secondary | ICD-10-CM

## 2018-06-15 LAB — BASIC METABOLIC PANEL
ANION GAP: 10 (ref 5–15)
BUN: 8 mg/dL (ref 6–20)
CALCIUM: 9.6 mg/dL (ref 8.9–10.3)
CO2: 22 mmol/L (ref 22–32)
Chloride: 108 mmol/L (ref 98–111)
Creatinine, Ser: 0.8 mg/dL (ref 0.44–1.00)
Glucose, Bld: 110 mg/dL — ABNORMAL HIGH (ref 70–99)
Potassium: 3.4 mmol/L — ABNORMAL LOW (ref 3.5–5.1)
Sodium: 140 mmol/L (ref 135–145)

## 2018-06-15 LAB — D-DIMER, QUANTITATIVE (NOT AT ARMC): D DIMER QUANT: 0.34 ug{FEU}/mL (ref 0.00–0.50)

## 2018-06-15 LAB — I-STAT CG4 LACTIC ACID, ED: Lactic Acid, Venous: 2.34 mmol/L (ref 0.5–1.9)

## 2018-06-15 MED ORDER — ALBUTEROL SULFATE (2.5 MG/3ML) 0.083% IN NEBU
2.5000 mg | INHALATION_SOLUTION | Freq: Once | RESPIRATORY_TRACT | Status: AC
  Administered 2018-06-15: 2.5 mg via RESPIRATORY_TRACT
  Filled 2018-06-15: qty 3

## 2018-06-15 MED ORDER — ONDANSETRON HCL 4 MG/2ML IJ SOLN
4.0000 mg | Freq: Once | INTRAMUSCULAR | Status: AC
  Administered 2018-06-15: 4 mg via INTRAVENOUS
  Filled 2018-06-15: qty 2

## 2018-06-15 MED ORDER — IPRATROPIUM-ALBUTEROL 0.5-2.5 (3) MG/3ML IN SOLN
3.0000 mL | Freq: Once | RESPIRATORY_TRACT | Status: AC
  Administered 2018-06-15: 3 mL via RESPIRATORY_TRACT
  Filled 2018-06-15: qty 3

## 2018-06-15 MED ORDER — FENTANYL CITRATE (PF) 100 MCG/2ML IJ SOLN
100.0000 ug | Freq: Once | INTRAMUSCULAR | Status: AC
  Administered 2018-06-15: 100 ug via INTRAVENOUS
  Filled 2018-06-15: qty 2

## 2018-06-15 NOTE — ED Notes (Signed)
Pt. Reports today at noon she became short of breath and was working.  Pt. Said she used her asthma inhaler and when she got home from work used her breathing treatment solution.. After second treatment at 2030 she decided not working so she came to ED.

## 2018-06-15 NOTE — ED Provider Notes (Signed)
[facility] Provider Note: Lowella DellJ. Lane Eleen Litz, MD, FACEP  CSN: [id number] MRN: [id number] ARRIVAL: 06/15/18 at 2220 ROOM: MH07/[facility]07   CHIEF COMPLAINT  Shortness of Breath   HISTORY OF PRESENT ILLNESS  06/15/18 10:50 PM Laura Sciarashley M [name] is a 27 y.o. female with a history of asthma.  She is here with shortness of breath and chest tightness that began several hours ago.  It was not relieved with her albuterol inhaler.  She states this is different due to the chest tightness and substernal pain radiating to her left neck.  She rates the pain as an 8 out of 10, worse with deep breathing or movement.  She was given an albuterol and Atrovent treatment prior to my evaluation with improved air movement except the left base which remains quiet.  She denies a fever.  She has been having a cough.  She denies nausea, vomiting or diarrhea.   Past Medical History:  Diagnosis Date  . Asthma     History reviewed. No pertinent surgical history.  Family History  Problem Relation Age of Onset  . Asthma Father   . Asthma Maternal Grandmother     Social History   Tobacco Use  . Smoking status: Never Smoker  . Smokeless tobacco: Never Used  Substance Use Topics  . Alcohol use: No  . Drug use: No    Prior to Admission medications   Medication Sig Start Date End Date Taking? Authorizing Provider  albuterol (PROVENTIL HFA;VENTOLIN HFA) 108 (90 Base) MCG/ACT inhaler Inhale 1-2 puffs into the lungs every 6 (six) hours as needed for wheezing or shortness of breath. 08/20/17   Dorothea OgleMyers, Iskra M, MD  azithromycin (ZITHROMAX) 250 MG tablet Take 1 tablet (250 mg total) by mouth daily. Take first 2 tablets together, then 1 every day until finished. 10/08/17   Charlynne PanderYao, David Hsienta, MD  cephALEXin (KEFLEX) 500 MG capsule Take 1 capsule (500 mg total) by mouth 3 (three) times daily. 10/08/17   Charlynne PanderYao, David Hsienta, MD  Cetirizine HCl (ZYRTEC ALLERGY) 10 MG CAPS Take 1 capsule (10 mg total) by mouth daily.  12/22/17   Aviva KluverMurray, Alyssa B, PA-C  FLOVENT HFA 110 MCG/ACT inhaler INL 2 PUFFS PO BID PRN FOR SOB AND WHEEZING 06/24/17   [provider]  fluticasone (FLONASE) 50 MCG/ACT nasal spray Place 1 spray into both nostrils daily. 12/22/17   Aviva KluverMurray, Alyssa B, PA-C  ipratropium-albuterol (DUONEB) 0.5-2.5 (3) MG/3ML SOLN Take 3 mLs by nebulization every 6 (six) hours as needed (sob and wheezing). 08/20/17   Dorothea OgleMyers, Iskra M, MD    Allergies Patient has no known allergies.   REVIEW OF SYSTEMS  Negative except as noted here or in the History of Present Illness.   PHYSICAL EXAMINATION  Initial Vital Signs Blood pressure 135/78, pulse 94, temperature 98.7 F (37.1 C), resp. rate (!) 22, height 5\' 4"  (1.626 m), weight 71.2 kg (157 lb), last menstrual period 06/12/2018, SpO2 96 %, not currently breastfeeding.  Examination General: Well-developed, well-nourished female in no acute distress; appearance consistent with age of record HENT: normocephalic; atraumatic Eyes: pupils equal, round and reactive to light; extraocular muscles intact Neck: supple Heart: regular rate and rhythm Lungs: Absent breath sounds in left base; no wheezing Abdomen: soft; nondistended; nontender; no masses or hepatosplenomegaly; bowel sounds present Extremities: No deformity; full range of motion; pulses normal Neurologic: Awake, alert and oriented; motor function intact in all extremities and symmetric; no facial droop Skin: Warm and dry Psychiatric: Normal mood and affect  RESULTS  Summary of this visit's results, reviewed by myself:   EKG Interpretation  Date/Time:  Monday June 15 2018 22:33:30 EDT Ventricular Rate:  96 PR Interval:    QRS Duration: 84 QT Interval:  352 QTC Calculation: 445 R Axis:   61 Text Interpretation:  Sinus rhythm Normal ECG Rate is slower Confirmed by Coy Vandoren ([id number]) on 06/15/2018 10:49:48 PM      Laboratory Studies: Results for orders placed or performed during the hospital  encounter of 06/15/18 (from the past 24 hour(s))  CBC with Differential/Platelet     Status: Abnormal   Collection Time: 06/15/18 10:31 PM  Result Value Ref Range   WBC 6.7 4.0 - 10.5 K/uL   RBC 5.25 (H) 3.87 - 5.11 MIL/uL   Hemoglobin 12.4 12.0 - 15.0 g/dL   HCT 60.4 54.0 - 98.1 %   MCV 69.9 (L) 78.0 - 100.0 fL   MCH 23.6 (L) 26.0 - 34.0 pg   MCHC 33.8 30.0 - 36.0 g/dL   RDW 19.1 47.8 - 29.5 %   Platelets 375 150 - 400 K/uL   Neutrophils Relative % 81 %   Lymphocytes Relative 13 %   Monocytes Relative 3 %   Eosinophils Relative 3 %   Basophils Relative 0 %   Neutro Abs 5.4 1.7 - 7.7 K/uL   Lymphs Abs 0.9 0.7 - 4.0 K/uL   Monocytes Absolute 0.2 0.1 - 1.0 K/uL   Eosinophils Absolute 0.2 0.0 - 0.7 K/uL   Basophils Absolute 0.0 0.0 - 0.1 K/uL   Smear Review MORPHOLOGY UNREMARKABLE   Basic metabolic panel     Status: Abnormal   Collection Time: 06/15/18 10:31 PM  Result Value Ref Range   Sodium 140 135 - 145 mmol/L   Potassium 3.4 (L) 3.5 - 5.1 mmol/L   Chloride 108 98 - 111 mmol/L   CO2 22 22 - 32 mmol/L   Glucose, Bld 110 (H) 70 - 99 mg/dL   BUN 8 6 - 20 mg/dL   Creatinine, Ser 6.21 0.44 - 1.00 mg/dL   Calcium 9.6 8.9 - 30.8 mg/dL   GFR calc non Af Amer >60 >60 mL/min   GFR calc Af Amer >60 >60 mL/min   Anion gap 10 5 - 15  D-dimer, quantitative (not at [facility])     Status: None   Collection Time: 06/15/18 10:31 PM  Result Value Ref Range   D-Dimer, Quant 0.34 0.00 - 0.50 ug/mL-FEU  I-Stat CG4 Lactic Acid, ED     Status: Abnormal   Collection Time: 06/15/18 11:25 PM  Result Value Ref Range   Lactic Acid, Venous 2.34 (HH) 0.5 - 1.9 mmol/L   Comment NOTIFIED PHYSICIAN    Imaging Studies: Dg Chest 2 View  Result Date: 06/15/2018 CLINICAL DATA:  Shortness of breath, chest tightness, cough EXAM: CHEST - 2 VIEW COMPARISON:  12/21/2017 FINDINGS: Central left upper lobe opacity, likely reflecting pneumonia in this young patient. Elevation of the left hemidiaphragm with  associated left basilar opacity, likely reflecting a combination of atelectasis/pneumonia and small left pleural effusion. Right lung is clear. Leftward cardiomediastinal shift. Visualized osseous structures are within normal limits. IMPRESSION: Suspected left upper lobe and possible left lower lobe pneumonia. Associated left pleural effusion. Follow-up chest radiographs are suggested in 4-6 weeks to document clearance. Electronically Signed   By: Charline Bills M.D.   On: 06/15/2018 23:17    ED COURSE and MDM  Nursing notes and initial vitals signs, including pulse oximetry, reviewed.  Vitals:  06/15/18 2231 06/15/18 2235 06/15/18 2247 06/15/18 2336  BP:    117/72  Pulse:    (!) 101  Resp:    (!) 25  Temp:      SpO2:  95% 96% 97%  Weight: 71.2 kg (157 lb)     Height: 5\' 4"  (1.626 m)      12:24 AM Rocephin and Zithromax ordered for community-acquired pneumonia.  Because she is asthmatic and has pneumonia in both lobes of the left lung we will have her admitted.  1:51 AM Patient accepted for admission to [facility].  PROCEDURES    ED DIAGNOSES     ICD-10-CM   1. Community acquired pneumonia of left upper lobe of lung (HCC) J18.1   2. Community acquired pneumonia of left lower lobe of lung (HCC) J18.1   3. Pleural effusion on left J90        Lexii Walsh, Jonny Ruiz, MD 06/16/18 [phone]

## 2018-06-15 NOTE — ED Triage Notes (Signed)
Presents with chest tightness that began a few hours ago associated with SOB. Pt reports that she tried her inhaler without relief. THis asthma attack is different from previous due to the chest tightness and feeling pain and pressure in her chest and neck.

## 2018-06-16 DIAGNOSIS — J189 Pneumonia, unspecified organism: Secondary | ICD-10-CM | POA: Diagnosis present

## 2018-06-16 DIAGNOSIS — E876 Hypokalemia: Secondary | ICD-10-CM | POA: Diagnosis not present

## 2018-06-16 DIAGNOSIS — R000 Tachycardia, unspecified: Secondary | ICD-10-CM | POA: Diagnosis present

## 2018-06-16 DIAGNOSIS — J181 Lobar pneumonia, unspecified organism: Secondary | ICD-10-CM | POA: Diagnosis not present

## 2018-06-16 DIAGNOSIS — J9811 Atelectasis: Secondary | ICD-10-CM | POA: Diagnosis present

## 2018-06-16 DIAGNOSIS — Z79899 Other long term (current) drug therapy: Secondary | ICD-10-CM | POA: Diagnosis not present

## 2018-06-16 DIAGNOSIS — J90 Pleural effusion, not elsewhere classified: Secondary | ICD-10-CM | POA: Diagnosis present

## 2018-06-16 DIAGNOSIS — Z825 Family history of asthma and other chronic lower respiratory diseases: Secondary | ICD-10-CM | POA: Diagnosis not present

## 2018-06-16 DIAGNOSIS — E872 Acidosis, unspecified: Secondary | ICD-10-CM | POA: Diagnosis present

## 2018-06-16 DIAGNOSIS — R112 Nausea with vomiting, unspecified: Secondary | ICD-10-CM | POA: Diagnosis present

## 2018-06-16 DIAGNOSIS — J45909 Unspecified asthma, uncomplicated: Secondary | ICD-10-CM | POA: Diagnosis present

## 2018-06-16 DIAGNOSIS — J918 Pleural effusion in other conditions classified elsewhere: Secondary | ICD-10-CM | POA: Diagnosis present

## 2018-06-16 LAB — BASIC METABOLIC PANEL
Anion gap: 6 (ref 5–15)
BUN: 8 mg/dL (ref 6–20)
CHLORIDE: 112 mmol/L — AB (ref 98–111)
CO2: 23 mmol/L (ref 22–32)
Calcium: 8.9 mg/dL (ref 8.9–10.3)
Creatinine, Ser: 0.63 mg/dL (ref 0.44–1.00)
GFR calc Af Amer: >60 mL/min (ref >60)
GFR calc non Af Amer: >60 mL/min (ref >60)
GLUCOSE: 113 mg/dL — AB (ref 70–99)
POTASSIUM: 4 mmol/L (ref 3.5–5.1)
SODIUM: 141 mmol/L (ref 135–145)

## 2018-06-16 LAB — CBC
HCT: 33.3 % — ABNORMAL LOW (ref 36.0–46.0)
Hemoglobin: 10.9 g/dL — ABNORMAL LOW (ref 12.0–15.0)
MCH: 23 pg — ABNORMAL LOW (ref 26.0–34.0)
MCHC: 32.7 g/dL (ref 30.0–36.0)
MCV: 70.4 fL — ABNORMAL LOW (ref 78.0–100.0)
Platelets: 351 10*3/uL (ref 150–400)
RBC: 4.73 MIL/uL (ref 3.87–5.11)
RDW: 15.1 % (ref 11.5–15.5)
WBC: 6.3 10*3/uL (ref 4.0–10.5)

## 2018-06-16 LAB — CBC WITH DIFFERENTIAL/PLATELET
BASOS ABS: 0 10*3/uL (ref 0.0–0.1)
Basophils Relative: 0 %
EOS ABS: 0.2 10*3/uL (ref 0.0–0.7)
Eosinophils Relative: 3 %
HEMATOCRIT: 36.7 % (ref 36.0–46.0)
HEMOGLOBIN: 12.4 g/dL (ref 12.0–15.0)
LYMPHS PCT: 13 %
Lymphs Abs: 0.9 10*3/uL (ref 0.7–4.0)
MCH: 23.6 pg — ABNORMAL LOW (ref 26.0–34.0)
MCHC: 33.8 g/dL (ref 30.0–36.0)
MCV: 69.9 fL — ABNORMAL LOW (ref 78.0–100.0)
Monocytes Absolute: 0.2 10*3/uL (ref 0.1–1.0)
Monocytes Relative: 3 %
NEUTROS ABS: 5.4 10*3/uL (ref 1.7–7.7)
Neutrophils Relative %: 81 %
Platelets: 375 10*3/uL (ref 150–400)
RBC: 5.25 MIL/uL — AB (ref 3.87–5.11)
RDW: 15.4 % (ref 11.5–15.5)
WBC: 6.7 10*3/uL (ref 4.0–10.5)

## 2018-06-16 LAB — STREP PNEUMONIAE URINARY ANTIGEN: Strep Pneumo Urinary Antigen: NEGATIVE

## 2018-06-16 LAB — LACTIC ACID, PLASMA: Lactic Acid, Venous: 0.8 mmol/L (ref 0.5–1.9)

## 2018-06-16 MED ORDER — GUAIFENESIN 100 MG/5ML PO SOLN: 5.0000 mL | ORAL | 0 refills | Status: AC | PRN

## 2018-06-16 MED ORDER — SODIUM CHLORIDE 0.9 % IV SOLN
INTRAVENOUS | Status: DC | PRN
  Administered 2018-06-16: 500 mL via INTRAVENOUS

## 2018-06-16 MED ORDER — KETOROLAC TROMETHAMINE 15 MG/ML IJ SOLN
15.0000 mg | Freq: Once | INTRAMUSCULAR | Status: AC
  Administered 2018-06-16: 15 mg via INTRAVENOUS

## 2018-06-16 MED ORDER — CEFDINIR 300 MG PO CAPS: 300.0000 mg | ORAL_CAPSULE | Freq: Two times a day (BID) | ORAL | 0 refills | Status: DC

## 2018-06-16 MED ORDER — AZITHROMYCIN 500 MG IV SOLR
INTRAVENOUS | Status: AC
  Filled 2018-06-16: qty 500

## 2018-06-16 MED ORDER — AZITHROMYCIN 500 MG PO TABS: 500.0000 mg | ORAL_TABLET | Freq: Every day | ORAL | 0 refills | Status: AC

## 2018-06-16 MED ORDER — ACETAMINOPHEN 500 MG PO TABS
1000.0000 mg | ORAL_TABLET | Freq: Four times a day (QID) | ORAL | Status: DC | PRN
  Administered 2018-06-16: 1000 mg via ORAL
  Filled 2018-06-16: qty 2

## 2018-06-16 MED ORDER — OXYCODONE HCL 5 MG PO TABS: 2.5000 mg | ORAL_TABLET | ORAL | Status: DC | PRN

## 2018-06-16 MED ORDER — SODIUM CHLORIDE 0.9 % IV SOLN
500.0000 mg | INTRAVENOUS | Status: DC
  Filled 2018-06-16: qty 500

## 2018-06-16 MED ORDER — SODIUM CHLORIDE 0.9 % IV SOLN
1.0000 g | INTRAVENOUS | Status: DC
  Filled 2018-06-16: qty 10

## 2018-06-16 MED ORDER — ENOXAPARIN SODIUM 40 MG/0.4ML ~~LOC~~ SOLN
40.0000 mg | SUBCUTANEOUS | Status: DC
  Administered 2018-06-16: 40 mg via SUBCUTANEOUS
  Filled 2018-06-16: qty 0.4

## 2018-06-16 MED ORDER — SODIUM CHLORIDE 0.9 % IV SOLN: INTRAVENOUS | Status: DC | PRN

## 2018-06-16 MED ORDER — SODIUM CHLORIDE 0.9 % IV SOLN
500.0000 mg | Freq: Once | INTRAVENOUS | Status: AC
  Administered 2018-06-16: 500 mg via INTRAVENOUS
  Filled 2018-06-16: qty 500

## 2018-06-16 MED ORDER — FLUTICASONE PROPIONATE 50 MCG/ACT NA SUSP
1.0000 | Freq: Every day | NASAL | Status: DC
  Administered 2018-06-16: 1 via NASAL
  Filled 2018-06-16: qty 16

## 2018-06-16 MED ORDER — SODIUM CHLORIDE 0.9 % IV SOLN
1.0000 g | Freq: Once | INTRAVENOUS | Status: AC
  Administered 2018-06-16: 1 g via INTRAVENOUS
  Filled 2018-06-16: qty 10

## 2018-06-16 MED ORDER — ONDANSETRON HCL 4 MG/2ML IJ SOLN
INTRAMUSCULAR | Status: AC
  Filled 2018-06-16: qty 2

## 2018-06-16 MED ORDER — SODIUM CHLORIDE 0.9 % IV SOLN
INTRAVENOUS | Status: DC
  Administered 2018-06-16 (×2): via INTRAVENOUS

## 2018-06-16 MED ORDER — IPRATROPIUM-ALBUTEROL 0.5-2.5 (3) MG/3ML IN SOLN
3.0000 mL | Freq: Four times a day (QID) | RESPIRATORY_TRACT | Status: DC | PRN
  Administered 2018-06-16 (×2): 3 mL via RESPIRATORY_TRACT
  Filled 2018-06-16 (×2): qty 3

## 2018-06-16 MED ORDER — GUAIFENESIN 100 MG/5ML PO SOLN
5.0000 mL | ORAL | Status: DC | PRN
  Administered 2018-06-16 (×2): 100 mg via ORAL
  Filled 2018-06-16 (×2): qty 10

## 2018-06-16 MED ORDER — ONDANSETRON HCL 4 MG/2ML IJ SOLN
4.0000 mg | Freq: Once | INTRAMUSCULAR | Status: AC
  Administered 2018-06-16: 4 mg via INTRAVENOUS

## 2018-06-16 MED ORDER — PROMETHAZINE HCL 25 MG/ML IJ SOLN
12.5000 mg | Freq: Four times a day (QID) | INTRAMUSCULAR | Status: DC | PRN
  Administered 2018-06-16: 12.5 mg via INTRAVENOUS
  Filled 2018-06-16: qty 1

## 2018-06-16 MED ORDER — KETOROLAC TROMETHAMINE 15 MG/ML IJ SOLN
INTRAMUSCULAR | Status: AC
  Filled 2018-06-16: qty 1

## 2018-06-16 NOTE — Discharge Summary (Signed)
Physician Discharge Summary  Laura Sciarashley M Rachel  WGN:[id number]  DOB: [birth year]  DOA: 06/15/2018 PCP: Patient, No Pcp Per  Admit date: 06/15/2018 Discharge date: 06/16/2018  Admitted From: Home  Disposition: Home   Recommendations for Outpatient Follow-up:  1. Follow up with PCP in 1 week 2. Get CXR in 4-6 weeks to ensure resolution of pneumonia 3. Please monitor the following pending results: Blood cultures and sputum culture.  Discharge Condition: Stable CODE STATUS: Full code Diet recommendation:  Regular   Brief/Interim Summary: For full details see H&P/Progress note, but in brief, Laura [name] is a-year-old female with medical history significant for asthma who presented to the emergency department complaining of shortness of breath and chest discomfort.  Symptoms started 1 day prior to admission having some chest tightness and severe shortness of breath that was not relieved by home inhalers.  Upon ED evaluation chest x-ray revealed left upper and lower lobe consolidation with small effusion, was tachycardic, with normal saturation.  Patient was admitted with working diagnosis of community-acquired pneumonia and was started on empiric antibiotics.  Patient subsequently improved, shortness of breath has resolved, and patient was deemed stable for discharge to complete antibiotic therapy as an outpatient.  Subjective: Patient seen and examined, denies shortness of breath, still cough and chest discomfort.  Afebrile, normal white count.  Tolerating regular diet well.  No acute events overnight.  Discharge Diagnoses/Hospital Course:  Community-acquired pneumonia Patient was treated with IV ceftriaxone and azithromycin.  Was given nebulizer treatments and cough suppressant. Patient is not hypoxic, not requiring oxygen supplementation.  She remains afebrile, normal white count okay to treat this as an outpatient with oral antibiotics.  Will discharge on azithromycin and Omnicef.  Continue  albuterol inhaler and cough suppressant as needed.  Need to repeat chest x-ray in 4 to 6 weeks to reassure resolution of checks x-ray findings.  Flutter valve to help with atelectasis.  Follow-up with PCP in 1 week.  Hypokalemia  Replaced, rechecked and K normal   Lactic acidosis - resolved   On the day of the discharge the patient's vitals were stable, and no other acute medical condition were reported by patient. the patient was felt safe to be discharge to home.   Discharge Instructions  You were cared for by a hospitalist during your hospital stay. If you have any questions about your discharge medications or the care you received while you were in the hospital after you are discharged, you can call the unit and asked to speak with the hospitalist on call if the hospitalist that took care of you is not available. Once you are discharged, your primary care physician will handle any further medical issues. Please note that NO REFILLS for any discharge medications will be authorized once you are discharged, as it is imperative that you return to your primary care physician (or establish a relationship with a primary care physician if you do not have one) for your aftercare needs so that they can reassess your need for medications and monitor your lab values.  Discharge Instructions    Call MD for:  difficulty breathing, headache or visual disturbances   Complete by:  As directed    Call MD for:  extreme fatigue   Complete by:  As directed    Call MD for:  hives   Complete by:  As directed    Call MD for:  persistant dizziness or light-headedness   Complete by:  As directed    Call MD for:  persistant nausea and vomiting   Complete by:  As directed    Call MD for:  redness, tenderness, or signs of infection (pain, swelling, redness, odor or green/yellow discharge around incision site)   Complete by:  As directed    Call MD for:  severe uncontrolled pain   Complete by:  As directed    Call  MD for:  temperature >100.4   Complete by:  As directed    Diet - low sodium heart healthy   Complete by:  As directed    Increase activity slowly   Complete by:  As directed      Allergies as of 06/16/2018   No Known Allergies     Medication List    TAKE these medications   albuterol 108 (90 Base) MCG/ACT inhaler Commonly known as:  PROVENTIL HFA;VENTOLIN HFA Inhale 1-2 puffs into the lungs every 6 (six) hours as needed for wheezing or shortness of breath.   azithromycin 500 MG tablet Commonly known as:  ZITHROMAX Take 1 tablet (500 mg total) by mouth daily for 2 days. Take 1 tablet daily for 3 days.   cefdinir 300 MG capsule Commonly known as:  OMNICEF Take 1 capsule (300 mg total) by mouth 2 (two) times daily.   FLOVENT HFA 110 MCG/ACT inhaler Generic drug:  fluticasone INL 2 PUFFS PO BID PRN FOR SOB AND WHEEZING   fluticasone 50 MCG/ACT nasal spray Commonly known as:  FLONASE Place 1 spray into both nostrils daily.   guaiFENesin 100 MG/5ML Soln Commonly known as:  ROBITUSSIN Take 5 mLs (100 mg total) by mouth every 4 (four) hours as needed for cough or to loosen phlegm.   ipratropium-albuterol 0.5-2.5 (3) MG/3ML Soln Commonly known as:  DUONEB Take 3 mLs by nebulization every 6 (six) hours as needed (sob and wheezing).   montelukast 10 MG tablet Commonly known as:  SINGULAIR Take 10 mg by mouth daily.       No Known Allergies  Consultations:  None   Procedures/Studies: Dg Chest 2 View  Result Date: 06/15/2018 CLINICAL DATA:  Shortness of breath, chest tightness, cough EXAM: CHEST - 2 VIEW COMPARISON:  12/21/2017 FINDINGS: Central left upper lobe opacity, likely reflecting pneumonia in this young patient. Elevation of the left hemidiaphragm with associated left basilar opacity, likely reflecting a combination of atelectasis/pneumonia and small left pleural effusion. Right lung is clear. Leftward cardiomediastinal shift. Visualized osseous structures are  within normal limits. IMPRESSION: Suspected left upper lobe and possible left lower lobe pneumonia. Associated left pleural effusion. Follow-up chest radiographs are suggested in 4-6 weeks to document clearance. Electronically Signed   By: Charline Bills M.D.   On: 06/15/2018 23:17    Discharge Exam: Vitals:   06/16/18 0320 06/16/18 0958  BP:    Pulse:    Resp:    Temp:    SpO2: 98% 97%   Vitals:   06/16/18 0138 06/16/18 0256 06/16/18 0320 06/16/18 0958  BP: 124/69 125/74    Pulse: 82 (!) 110    Resp: (!) 24 (!) 23    Temp: 98.6 F (37 C) 98.3 F (36.8 C)    TempSrc: Oral Oral    SpO2: 98% 95% 98% 97%  Weight:      Height:        General: NAD  Cardiovascular: RRR, S1/S2 +, no rubs, no gallops Respiratory: Good air movement, decrease breath sounds at the LLL, mild late expiratory wheezing at the lf field  Abdominal: Soft, NT, ND, bowel  sounds + Extremities: no edema  The results of significant diagnostics from this hospitalization (including imaging, microbiology, ancillary and laboratory) are listed below for reference.     Microbiology: No results found for this or any previous visit (from the past 240 hour(s)).   Labs: BNP (last 3 results) No results for input(s): BNP in the last 8760 hours. Basic Metabolic Panel: Recent Labs  Lab 06/15/18 2231 06/16/18 0837  NA 140 141  K 3.4* 4.0  CL 108 112*  CO2 22 23  GLUCOSE 110* 113*  BUN 8 8  CREATININE 0.80 0.63  CALCIUM 9.6 8.9   Liver Function Tests: No results for input(s): AST, ALT, ALKPHOS, BILITOT, PROT, ALBUMIN in the last 168 hours. No results for input(s): LIPASE, AMYLASE in the last 168 hours. No results for input(s): AMMONIA in the last 168 hours. CBC: Recent Labs  Lab 06/15/18 2231 06/16/18 0837  WBC 6.7 6.3  NEUTROABS 5.4  --   HGB 12.4 10.9*  HCT 36.7 33.3*  MCV 69.9* 70.4*  PLT 375 351   Cardiac Enzymes: No results for input(s): CKTOTAL, CKMB, CKMBINDEX, TROPONINI in the last 168  hours. BNP: Invalid input(s): POCBNP CBG: No results for input(s): GLUCAP in the last 168 hours. D-Dimer Recent Labs    06/15/18 2231  DDIMER 0.34   Hgb A1c No results for input(s): HGBA1C in the last 72 hours. Lipid Profile No results for input(s): CHOL, HDL, LDLCALC, TRIG, CHOLHDL, LDLDIRECT in the last 72 hours. Thyroid function studies No results for input(s): TSH, T4TOTAL, T3FREE, THYROIDAB in the last 72 hours.  Invalid input(s): FREET3 Anemia work up No results for input(s): VITAMINB12, FOLATE, FERRITIN, TIBC, IRON, RETICCTPCT in the last 72 hours. Urinalysis    Component Value Date/Time   COLORURINE YELLOW 10/08/2017 1018   APPEARANCEUR CLEAR 10/08/2017 1018   LABSPEC 1.015 10/08/2017 1018   PHURINE 7.0 10/08/2017 1018   GLUCOSEU NEGATIVE 10/08/2017 1018   HGBUR TRACE (A) 10/08/2017 1018   BILIRUBINUR SMALL (A) 10/08/2017 1018   KETONESUR >80 (A) 10/08/2017 1018   PROTEINUR 30 (A) 10/08/2017 1018   UROBILINOGEN 2.0 (H) 11/18/2014 1713   NITRITE NEGATIVE 10/08/2017 1018   LEUKOCYTESUR NEGATIVE 10/08/2017 1018   Sepsis Labs Invalid input(s): PROCALCITONIN,  WBC,  LACTICIDVEN Microbiology No results found for this or any previous visit (from the past 240 hour(s)).   Time coordinating discharge: 35 minutes  SIGNED:  Latrelle Dodrill, MD  [facility] 06/16/2018, 11:22 AM  Pager please text page via  [email or web address]  Note - This record has been created using [employer]. Chart creation errors have been sought, but may not always have been located. Such creation errors do not reflect on the standard of medical care.

## 2018-06-16 NOTE — H&P (Signed)
History and Physical  Laura [name] UJW:[id number] DOB: 10-06-1991 DOA: 06/15/2018   PCP: Patient, No Pcp Per   Patient coming from: Home via [facility]   Chief Complaint: shortness of breath and chest discomfort   HPI: Laura [name] is a 27 y.o. female with medical history significant for asthma usually well controlled with PRN albuterol and Duonebs as needed who is being admitted with left sided community acquired pneumonia. She was in her usual state of health until about noon yesterday 7/29 when she started to feel short of breath, had some chest tightness (like something stuck midway down her esophagus) and a dry cough. Usually when she has coughing spells and asthma exacerbation about 3-5 times per month she is able to control it with her Duonebs but this time she wasn't getting better so presented to [facility] for evaluation. She denies any productive cough, pleuritic chest pain, fevers, chills, night sweats, recent weight loss, constipation or diarrhea. She had pneumonia last year as well.    ED Course: In the ER, she was not hypoxic but CXR revealed left upper and lower lobe consolidation with associated small effusion and presumed atelectasis. Due to these findings, she was give IV azithromycin/rocephin and Hospitalist was contactd for admission to [facility].  Review of Systems: Please see HPI for pertinent positives and negatives. A complete 10 system review of systems are otherwise negative.  Past Medical History:  Diagnosis Date  . Asthma    History reviewed. No pertinent surgical history.  Social History:  reports that she has never smoked. She has never used smokeless tobacco. She reports that she does not drink alcohol or use drugs.   No Known Allergies  Family History  Problem Relation Age of Onset  . Asthma Father   . Asthma Maternal Grandmother      Prior to Admission medications   Medication Sig Start Date End Date Taking? Authorizing Provider  albuterol  (PROVENTIL HFA;VENTOLIN HFA) 108 (90 Base) MCG/ACT inhaler Inhale 1-2 puffs into the lungs every 6 (six) hours as needed for wheezing or shortness of breath. 08/20/17  Yes Dorothea Ogle, MD  FLOVENT HFA 110 MCG/ACT inhaler INL 2 PUFFS PO BID PRN FOR SOB AND WHEEZING 06/24/17  Yes [provider]  fluticasone (FLONASE) 50 MCG/ACT nasal spray Place 1 spray into both nostrils daily. 12/22/17  Yes Murray, Alyssa B, PA-C  ipratropium-albuterol (DUONEB) 0.5-2.5 (3) MG/3ML SOLN Take 3 mLs by nebulization every 6 (six) hours as needed (sob and wheezing). 08/20/17  Yes Dorothea Ogle, MD  montelukast (SINGULAIR) 10 MG tablet Take 10 mg by mouth daily. 05/04/18  Yes [provider]    Physical Exam: BP 125/74 (BP Location: Right Arm)   Pulse (!) 110   Temp 98.3 F (36.8 C) (Oral)   Resp (!) 23   Ht 5\' 4"  (1.626 m)   Wt 71.2 kg (157 lb)   LMP 06/12/2018 (Approximate)   SpO2 95%   Breastfeeding? No   BMI 26.95 kg/m   General:  Alert, oriented, calm, in no acute distress, speaking full sentences, no cough  Eyes: EOMI, clear conjuctivae, white sclerea Neck: supple, no masses, trachea mildline  Cardiovascular: tachycardic and regular, no murmurs or rubs, no peripheral edema  Respiratory: no wheezes, no crackles, tachypnea, stridor; left basilar breath sounds absent Abdomen: soft, nontender, nondistended, normal bowel tones heard  Skin: dry, no rashes  Musculoskeletal: no joint effusions, normal range of motion  Psychiatric: appropriate affect, normal speech  Neurologic: extraocular  muscles intact, clear speech, moving all extremities with intact sensorium           Labs on Admission:  Basic Metabolic Panel: Recent Labs  Lab 06/15/18 2231  NA 140  K 3.4*  CL 108  CO2 22  GLUCOSE 110*  BUN 8  CREATININE 0.80  CALCIUM 9.6   Liver Function Tests: No results for input(s): AST, ALT, ALKPHOS, BILITOT, PROT, ALBUMIN in the last 168 hours. No results for input(s): LIPASE,  AMYLASE in the last 168 hours. No results for input(s): AMMONIA in the last 168 hours. CBC: Recent Labs  Lab 06/15/18 2231  WBC 6.7  NEUTROABS 5.4  HGB 12.4  HCT 36.7  MCV 69.9*  PLT 375   Cardiac Enzymes: No results for input(s): CKTOTAL, CKMB, CKMBINDEX, TROPONINI in the last 168 hours.  BNP (last 3 results) No results for input(s): BNP in the last 8760 hours.  ProBNP (last 3 results) No results for input(s): PROBNP in the last 8760 hours.  CBG: No results for input(s): GLUCAP in the last 168 hours.  Radiological Exams on Admission: Dg Chest 2 View  Result Date: 06/15/2018 CLINICAL DATA:  Shortness of breath, chest tightness, cough EXAM: CHEST - 2 VIEW COMPARISON:  12/21/2017 FINDINGS: Central left upper lobe opacity, likely reflecting pneumonia in this young patient. Elevation of the left hemidiaphragm with associated left basilar opacity, likely reflecting a combination of atelectasis/pneumonia and small left pleural effusion. Right lung is clear. Leftward cardiomediastinal shift. Visualized osseous structures are within normal limits. IMPRESSION: Suspected left upper lobe and possible left lower lobe pneumonia. Associated left pleural effusion. Follow-up chest radiographs are suggested in 4-6 weeks to document clearance. Electronically Signed   By: Charline BillsSriyesh  Krishnan M.D.   On: 06/15/2018 23:17    Assessment/Plan Present on Admission: . Community acquired pneumonia . Hypokalemia . Asthma exacerbation . Nausea & vomiting . Lactic acidosis . Tachycardia  27 year old female with history of Asthma being admitted with left sided community acquired pneumonia with associated small pleural effusion and atelectasis.   Community acquired pneumonia - with cough, abnormal chest xray; she is not hypoxic and not in respiratory distress. Treat with IV azithromycin/rocephin, oxygen as needed and PRN Duoneb treatments per RT.  Atelectasis - suspected due to retracted left hemidiaphragm;  Mucinex and PRN breathing treatments   Hypokalemia - mild, recheck labs in AM   Nausea & vomiting - unclear etiology, could be due to IV abx. PRN Zofran and PRN Phenergan for breakthrough nausea   Lactic acidosis - due to PNA, IVF, check repeat lactate in AM   Tachycardia - due to infection possibly due to pain as well, continue IVF  DVT prophylaxis: Lovenox   Code Status: FULL   Family Communication: None present   Disposition Plan: Home at DC.   Consults called: None   Admission status: Inpatient   Time spent: 39 minutes  Mir Vergie LivingMohammed Ikramullah MD [facility] Pager [phone]  If 7PM-7AM, please contact night-coverage [email or web address] Password [email or web address]  06/16/2018, 3:21 AM

## 2018-06-17 LAB — HIV ANTIBODY (ROUTINE TESTING W REFLEX): HIV Screen 4th Generation wRfx: NONREACTIVE

## 2018-06-18 LAB — LEGIONELLA PNEUMOPHILA SEROGP 1 UR AG: L. PNEUMOPHILA SEROGP 1 UR AG: NEGATIVE

## 2018-07-22 ENCOUNTER — Other Ambulatory Visit: Payer: Self-pay

## 2018-07-22 ENCOUNTER — Inpatient Hospital Stay (HOSPITAL_BASED_OUTPATIENT_CLINIC_OR_DEPARTMENT_OTHER)
Admission: EM | Admit: 2018-07-22 | Discharge: 2018-07-26 | DRG: 208 | Disposition: A | Discharge status: Home / Self Care | Payer: Self-pay | Source: Other Acute Inpatient Hospital | Attending: Internal Medicine | Admitting: Internal Medicine

## 2018-07-22 ENCOUNTER — Encounter (HOSPITAL_BASED_OUTPATIENT_CLINIC_OR_DEPARTMENT_OTHER): Payer: Self-pay | Admitting: Emergency Medicine

## 2018-07-22 ENCOUNTER — Inpatient Hospital Stay (HOSPITAL_COMMUNITY): Payer: Self-pay

## 2018-07-22 ENCOUNTER — Emergency Department (HOSPITAL_BASED_OUTPATIENT_CLINIC_OR_DEPARTMENT_OTHER): Payer: Self-pay

## 2018-07-22 DIAGNOSIS — Z781 Physical restraint status: Secondary | ICD-10-CM

## 2018-07-22 DIAGNOSIS — J45902 Unspecified asthma with status asthmaticus: Principal | ICD-10-CM | POA: Diagnosis present

## 2018-07-22 DIAGNOSIS — Z79899 Other long term (current) drug therapy: Secondary | ICD-10-CM

## 2018-07-22 DIAGNOSIS — J4552 Severe persistent asthma with status asthmaticus: Secondary | ICD-10-CM

## 2018-07-22 DIAGNOSIS — J069 Acute upper respiratory infection, unspecified: Secondary | ICD-10-CM | POA: Diagnosis present

## 2018-07-22 DIAGNOSIS — Z9119 Patient's noncompliance with other medical treatment and regimen: Secondary | ICD-10-CM

## 2018-07-22 DIAGNOSIS — Z23 Encounter for immunization: Secondary | ICD-10-CM

## 2018-07-22 DIAGNOSIS — Z978 Presence of other specified devices: Secondary | ICD-10-CM

## 2018-07-22 DIAGNOSIS — Z825 Family history of asthma and other chronic lower respiratory diseases: Secondary | ICD-10-CM

## 2018-07-22 DIAGNOSIS — B9789 Other viral agents as the cause of diseases classified elsewhere: Secondary | ICD-10-CM | POA: Diagnosis present

## 2018-07-22 DIAGNOSIS — J9690 Respiratory failure, unspecified, unspecified whether with hypoxia or hypercapnia: Secondary | ICD-10-CM

## 2018-07-22 DIAGNOSIS — E872 Acidosis: Secondary | ICD-10-CM | POA: Diagnosis present

## 2018-07-22 DIAGNOSIS — J9601 Acute respiratory failure with hypoxia: Secondary | ICD-10-CM

## 2018-07-22 DIAGNOSIS — D72829 Elevated white blood cell count, unspecified: Secondary | ICD-10-CM | POA: Diagnosis present

## 2018-07-22 DIAGNOSIS — Z7951 Long term (current) use of inhaled steroids: Secondary | ICD-10-CM

## 2018-07-22 DIAGNOSIS — E876 Hypokalemia: Secondary | ICD-10-CM | POA: Diagnosis present

## 2018-07-22 DIAGNOSIS — J45909 Unspecified asthma, uncomplicated: Secondary | ICD-10-CM

## 2018-07-22 LAB — I-STAT VENOUS BLOOD GAS, ED
Acid-base deficit: 7 mmol/L — ABNORMAL HIGH (ref 0.0–2.0)
BICARBONATE: 18.2 mmol/L — AB (ref 20.0–28.0)
O2 Saturation: 95 %
TCO2: 19 mmol/L — ABNORMAL LOW (ref 22–32)
pCO2, Ven: 34 mmHg — ABNORMAL LOW (ref 44.0–60.0)
pH, Ven: 7.335 (ref 7.250–7.430)
pO2, Ven: 76 mmHg — ABNORMAL HIGH (ref 32.0–45.0)

## 2018-07-22 LAB — RESPIRATORY PANEL BY PCR
ADENOVIRUS-RVPPCR: NOT DETECTED
Bordetella pertussis: NOT DETECTED
CHLAMYDOPHILA PNEUMONIAE-RVPPCR: NOT DETECTED
CORONAVIRUS 229E-RVPPCR: NOT DETECTED
CORONAVIRUS NL63-RVPPCR: NOT DETECTED
CORONAVIRUS OC43-RVPPCR: NOT DETECTED
Coronavirus HKU1: NOT DETECTED
INFLUENZA A-RVPPCR: NOT DETECTED
Influenza B: NOT DETECTED
MYCOPLASMA PNEUMONIAE-RVPPCR: NOT DETECTED
Metapneumovirus: NOT DETECTED
PARAINFLUENZA VIRUS 4-RVPPCR: NOT DETECTED
Parainfluenza Virus 1: NOT DETECTED
Parainfluenza Virus 2: NOT DETECTED
Parainfluenza Virus 3: NOT DETECTED
Respiratory Syncytial Virus: NOT DETECTED
Rhinovirus / Enterovirus: DETECTED — AB

## 2018-07-22 LAB — POCT I-STAT 3, ART BLOOD GAS (G3+)
ACID-BASE DEFICIT: 4 mmol/L — AB (ref 0.0–2.0)
BICARBONATE: 25.3 mmol/L (ref 20.0–28.0)
O2 SAT: 100 %
PCO2 ART: 65 mmHg — AB (ref 32.0–48.0)
PO2 ART: 360 mmHg — AB (ref 83.0–108.0)
Patient temperature: 97.8
TCO2: 27 mmol/L (ref 22–32)
pH, Arterial: 7.196 — CL (ref 7.350–7.450)

## 2018-07-22 LAB — MAGNESIUM: Magnesium: 2.3 mg/dL (ref 1.7–2.4)

## 2018-07-22 LAB — GLUCOSE, CAPILLARY
GLUCOSE-CAPILLARY: 135 mg/dL — AB (ref 70–99)
GLUCOSE-CAPILLARY: 167 mg/dL — AB (ref 70–99)
Glucose-Capillary: 126 mg/dL — ABNORMAL HIGH (ref 70–99)

## 2018-07-22 LAB — CBC
HCT: 37.3 % (ref 36.0–46.0)
HEMATOCRIT: 36.3 % (ref 36.0–46.0)
Hemoglobin: 12 g/dL (ref 12.0–15.0)
Hemoglobin: 11.2 g/dL — ABNORMAL LOW (ref 12.0–15.0)
MCH: 23.4 pg — AB (ref 26.0–34.0)
MCH: 22.8 pg — AB (ref 26.0–34.0)
MCHC: 33.1 g/dL (ref 30.0–36.0)
MCHC: 30 g/dL (ref 30.0–36.0)
MCV: 70.9 fL — ABNORMAL LOW (ref 78.0–100.0)
MCV: 75.8 fL — ABNORMAL LOW (ref 78.0–100.0)
PLATELETS: 377 10*3/uL (ref 150–400)
Platelets: 373 10*3/uL (ref 150–400)
RBC: 5.12 MIL/uL — ABNORMAL HIGH (ref 3.87–5.11)
RBC: 4.92 MIL/uL (ref 3.87–5.11)
RDW: 15 % (ref 11.5–15.5)
RDW: 15.3 % (ref 11.5–15.5)
WBC: 15.6 10*3/uL — ABNORMAL HIGH (ref 4.0–10.5)
WBC: 18.9 10*3/uL — ABNORMAL HIGH (ref 4.0–10.5)

## 2018-07-22 LAB — LACTIC ACID, PLASMA
Lactic Acid, Venous: 2.6 mmol/L (ref 0.5–1.9)
Lactic Acid, Venous: 3 mmol/L (ref 0.5–1.9)

## 2018-07-22 LAB — COMPREHENSIVE METABOLIC PANEL
ALBUMIN: 4.2 g/dL (ref 3.5–5.0)
ALK PHOS: 46 U/L (ref 38–126)
ALT: 15 U/L (ref 0–44)
AST: 25 U/L (ref 15–41)
Anion gap: 13 (ref 5–15)
BUN: 10 mg/dL (ref 6–20)
CO2: 20 mmol/L — AB (ref 22–32)
Calcium: 8.7 mg/dL — ABNORMAL LOW (ref 8.9–10.3)
Chloride: 105 mmol/L (ref 98–111)
Creatinine, Ser: 0.69 mg/dL (ref 0.44–1.00)
GFR calc Af Amer: >60 mL/min (ref >60)
GFR calc non Af Amer: >60 mL/min (ref >60)
GLUCOSE: 182 mg/dL — AB (ref 70–99)
POTASSIUM: 2.8 mmol/L — AB (ref 3.5–5.1)
SODIUM: 138 mmol/L (ref 135–145)
Total Bilirubin: 0.8 mg/dL (ref 0.3–1.2)
Total Protein: 7.5 g/dL (ref 6.5–8.1)

## 2018-07-22 LAB — STREP PNEUMONIAE URINARY ANTIGEN: Strep Pneumo Urinary Antigen: NEGATIVE

## 2018-07-22 LAB — BASIC METABOLIC PANEL
ANION GAP: 9 (ref 5–15)
BUN: 8 mg/dL (ref 6–20)
CALCIUM: 8.5 mg/dL — AB (ref 8.9–10.3)
CO2: 24 mmol/L (ref 22–32)
Chloride: 107 mmol/L (ref 98–111)
Creatinine, Ser: 0.82 mg/dL (ref 0.44–1.00)
Glucose, Bld: 177 mg/dL — ABNORMAL HIGH (ref 70–99)
POTASSIUM: 4 mmol/L (ref 3.5–5.1)
Sodium: 140 mmol/L (ref 135–145)

## 2018-07-22 LAB — TRIGLYCERIDES: TRIGLYCERIDES: 13 mg/dL (ref <150)

## 2018-07-22 LAB — D-DIMER, QUANTITATIVE (NOT AT ARMC): D DIMER QUANT: 0.29 ug{FEU}/mL (ref 0.00–0.50)

## 2018-07-22 LAB — PHOSPHORUS: Phosphorus: 5.3 mg/dL — ABNORMAL HIGH (ref 2.5–4.6)

## 2018-07-22 LAB — MRSA PCR SCREENING: MRSA by PCR: NEGATIVE

## 2018-07-22 MED ORDER — ENOXAPARIN SODIUM 30 MG/0.3ML ~~LOC~~ SOLN
30.0000 mg | SUBCUTANEOUS | Status: DC
  Administered 2018-07-22 – 2018-07-23 (×2): 30 mg via SUBCUTANEOUS
  Filled 2018-07-22 (×2): qty 0.3

## 2018-07-22 MED ORDER — MAGNESIUM SULFATE 2 GM/50ML IV SOLN
2.0000 g | Freq: Once | INTRAVENOUS | Status: AC
  Administered 2018-07-22: 2 g via INTRAVENOUS
  Filled 2018-07-22: qty 50

## 2018-07-22 MED ORDER — CHLORHEXIDINE GLUCONATE 0.12% ORAL RINSE (MEDLINE KIT)
15.0000 mL | Freq: Two times a day (BID) | OROMUCOSAL | Status: DC
  Administered 2018-07-22 – 2018-07-24 (×4): 15 mL via OROMUCOSAL

## 2018-07-22 MED ORDER — POTASSIUM CHLORIDE 20 MEQ PO PACK
40.0000 meq | PACK | Freq: Three times a day (TID) | ORAL | Status: AC
  Administered 2018-07-22 – 2018-07-23 (×2): 40 meq via ORAL
  Filled 2018-07-22 (×3): qty 2

## 2018-07-22 MED ORDER — PROPOFOL BOLUS VIA INFUSION: 1.0000 mg/kg | Freq: Once | INTRAVENOUS | Status: DC

## 2018-07-22 MED ORDER — ALBUTEROL (5 MG/ML) CONTINUOUS INHALATION SOLN
10.0000 mg/h | INHALATION_SOLUTION | RESPIRATORY_TRACT | Status: AC
  Administered 2018-07-22: 10 mg/h via RESPIRATORY_TRACT

## 2018-07-22 MED ORDER — MIDAZOLAM HCL 2 MG/2ML IJ SOLN
2.0000 mg | Freq: Once | INTRAMUSCULAR | Status: AC
  Administered 2018-07-22: 2 mg via INTRAVENOUS

## 2018-07-22 MED ORDER — LIDOCAINE 1% INJECTION FOR CIRCUMCISION: 3.0000 mL | INJECTION | Freq: Once | INTRAVENOUS | Status: DC

## 2018-07-22 MED ORDER — FENTANYL CITRATE (PF) 100 MCG/2ML IJ SOLN
INTRAMUSCULAR | Status: AC
  Administered 2018-07-22: 100 ug
  Filled 2018-07-22: qty 2

## 2018-07-22 MED ORDER — ORAL CARE MOUTH RINSE
15.0000 mL | OROMUCOSAL | Status: DC
  Administered 2018-07-22 – 2018-07-24 (×17): 15 mL via OROMUCOSAL

## 2018-07-22 MED ORDER — ONDANSETRON HCL 4 MG/2ML IJ SOLN
4.0000 mg | Freq: Once | INTRAMUSCULAR | Status: AC
  Administered 2018-07-22: 4 mg via INTRAVENOUS
  Filled 2018-07-22: qty 2

## 2018-07-22 MED ORDER — ROCURONIUM BROMIDE 50 MG/5ML IV SOLN
1.0000 mg/kg | Freq: Once | INTRAVENOUS | Status: AC
  Administered 2018-07-22: 10 mg via INTRAVENOUS

## 2018-07-22 MED ORDER — ONDANSETRON HCL 4 MG/2ML IJ SOLN
4.0000 mg | Freq: Once | INTRAMUSCULAR | Status: AC
  Administered 2018-07-22: 4 mg via INTRAVENOUS

## 2018-07-22 MED ORDER — LACTATED RINGERS IV BOLUS
1000.0000 mL | Freq: Once | INTRAVENOUS | Status: AC
  Administered 2018-07-22: 1000 mL via INTRAVENOUS

## 2018-07-22 MED ORDER — PROPOFOL 1000 MG/100ML IV EMUL
0.0000 ug/kg/min | INTRAVENOUS | Status: DC
  Administered 2018-07-22: 15 ug/kg/min via INTRAVENOUS
  Administered 2018-07-23: 50 ug/kg/min via INTRAVENOUS
  Administered 2018-07-23: 40 ug/kg/min via INTRAVENOUS
  Administered 2018-07-23 – 2018-07-24 (×6): 50 ug/kg/min via INTRAVENOUS
  Filled 2018-07-22 (×9): qty 100

## 2018-07-22 MED ORDER — PROPOFOL 1000 MG/100ML IV EMUL
INTRAVENOUS | Status: AC
  Administered 2018-07-22: 15 ug/kg/min via INTRAVENOUS
  Filled 2018-07-22: qty 100

## 2018-07-22 MED ORDER — IPRATROPIUM BROMIDE 0.02 % IN SOLN
RESPIRATORY_TRACT | Status: AC
  Administered 2018-07-22: 0.5 mg
  Filled 2018-07-22: qty 2.5

## 2018-07-22 MED ORDER — LORAZEPAM 2 MG/ML IJ SOLN
1.0000 mg | Freq: Once | INTRAMUSCULAR | Status: AC
  Administered 2018-07-22: 1 mg via INTRAVENOUS
  Filled 2018-07-22: qty 1

## 2018-07-22 MED ORDER — SODIUM CHLORIDE 0.9 % IV SOLN
INTRAVENOUS | Status: DC | PRN
  Administered 2018-07-22: 250 mL via INTRAVENOUS

## 2018-07-22 MED ORDER — MONTELUKAST SODIUM 10 MG PO TABS
10.0000 mg | ORAL_TABLET | Freq: Every day | ORAL | Status: DC
  Administered 2018-07-22 – 2018-07-25 (×4): 10 mg via ORAL
  Filled 2018-07-22 (×4): qty 1

## 2018-07-22 MED ORDER — ACETAMINOPHEN 325 MG PO TABS: 650.0000 mg | ORAL_TABLET | ORAL | Status: DC | PRN

## 2018-07-22 MED ORDER — ALBUTEROL SULFATE (2.5 MG/3ML) 0.083% IN NEBU
2.5000 mg | INHALATION_SOLUTION | RESPIRATORY_TRACT | Status: DC | PRN
  Administered 2018-07-24 – 2018-07-26 (×3): 2.5 mg via RESPIRATORY_TRACT
  Filled 2018-07-22 (×3): qty 3

## 2018-07-22 MED ORDER — METHYLPREDNISOLONE SODIUM SUCC 40 MG IJ SOLR
40.0000 mg | Freq: Four times a day (QID) | INTRAMUSCULAR | Status: DC
  Administered 2018-07-22 – 2018-07-23 (×3): 40 mg via INTRAVENOUS
  Filled 2018-07-22 (×4): qty 1

## 2018-07-22 MED ORDER — SODIUM CHLORIDE 0.9 % IV SOLN
250.0000 mL | INTRAVENOUS | Status: DC | PRN
  Administered 2018-07-22 (×2): 10 mL via INTRAVENOUS
  Administered 2018-07-23: 250 mL via INTRAVENOUS

## 2018-07-22 MED ORDER — FAMOTIDINE IN NACL 20-0.9 MG/50ML-% IV SOLN
20.0000 mg | Freq: Two times a day (BID) | INTRAVENOUS | Status: DC
  Administered 2018-07-22 – 2018-07-25 (×7): 20 mg via INTRAVENOUS
  Filled 2018-07-22 (×8): qty 50

## 2018-07-22 MED ORDER — FENTANYL 2500MCG IN NS 250ML (10MCG/ML) PREMIX INFUSION
0.0000 ug/h | INTRAVENOUS | Status: DC
  Administered 2018-07-22 (×2): 25 ug/h via INTRAVENOUS
  Administered 2018-07-23: 100 ug/h via INTRAVENOUS
  Filled 2018-07-22 (×2): qty 250

## 2018-07-22 MED ORDER — METHYLPREDNISOLONE SODIUM SUCC 125 MG IJ SOLR
125.0000 mg | Freq: Once | INTRAMUSCULAR | Status: AC
  Administered 2018-07-22: 125 mg via INTRAVENOUS
  Filled 2018-07-22: qty 2

## 2018-07-22 MED ORDER — SODIUM CHLORIDE 0.9 % IV BOLUS
500.0000 mL | Freq: Once | INTRAVENOUS | Status: AC
  Administered 2018-07-22: 500 mL via INTRAVENOUS

## 2018-07-22 MED ORDER — ALBUTEROL SULFATE (2.5 MG/3ML) 0.083% IN NEBU
2.5000 mg | INHALATION_SOLUTION | RESPIRATORY_TRACT | Status: DC
  Administered 2018-07-22 – 2018-07-25 (×15): 2.5 mg via RESPIRATORY_TRACT
  Filled 2018-07-22 (×15): qty 3

## 2018-07-22 MED ORDER — FENTANYL CITRATE (PF) 100 MCG/2ML IJ SOLN: 100.0000 ug | INTRAMUSCULAR | Status: DC | PRN

## 2018-07-22 MED ORDER — ONDANSETRON HCL 4 MG/2ML IJ SOLN
INTRAMUSCULAR | Status: AC
  Administered 2018-07-22: 4 mg via INTRAVENOUS
  Filled 2018-07-22: qty 2

## 2018-07-22 MED ORDER — ALBUTEROL (5 MG/ML) CONTINUOUS INHALATION SOLN
INHALATION_SOLUTION | RESPIRATORY_TRACT | Status: AC
  Administered 2018-07-22: 3 mg/h
  Filled 2018-07-22: qty 20

## 2018-07-22 MED ORDER — SODIUM CHLORIDE 0.9 % IV BOLUS: 500.0000 mL | Freq: Once | INTRAVENOUS | Status: AC

## 2018-07-22 MED ORDER — LACTATED RINGERS IV BOLUS
500.0000 mL | Freq: Once | INTRAVENOUS | Status: AC
  Administered 2018-07-22: 500 mL via INTRAVENOUS

## 2018-07-22 MED ORDER — FENTANYL CITRATE (PF) 100 MCG/2ML IJ SOLN
100.0000 ug | INTRAMUSCULAR | Status: AC | PRN
  Administered 2018-07-22 (×3): 100 ug via INTRAVENOUS
  Filled 2018-07-22 (×4): qty 2

## 2018-07-22 MED ORDER — SODIUM CHLORIDE 0.9 % IV SOLN
INTRAVENOUS | Status: DC
  Administered 2018-07-22 – 2018-07-24 (×4): via INTRAVENOUS

## 2018-07-22 MED ORDER — MIDAZOLAM HCL 2 MG/2ML IJ SOLN
INTRAMUSCULAR | Status: AC
  Filled 2018-07-22: qty 2

## 2018-07-22 NOTE — ED Notes (Addendum)
Called [employer], Mother#[phone], to inform of bed assignment at [facility]

## 2018-07-22 NOTE — Progress Notes (Signed)
eLink Physician-Brief Progress Note Patient Name: Laura [name] DOB: [birth year] MRN: [id number]   Date of Service  07/22/2018  HPI/Events of Note  Resp acidosis and mild increase in serum lactate from baseline.  eICU Interventions  Increase resp rate to 24 from 20. Mild lactic acidosis likely due to increased work of breathing. Pt likely will benefit from hydration so will give a 500 ml LR fluid bolus.     Intervention Category Major Interventions: Respiratory failure - evaluation and management  Migdalia Dk 07/22/2018, 7:49 PM

## 2018-07-22 NOTE — ED Notes (Signed)
[facility] at bedside to pick up patient. Placed pt on bipap for transport. Pt jerked bipap off and said "I need another treatment. I can't breath with this "bipap". Placed patient on 10mg  Albuterol Continuous for transport. MD at bedside and ok to transport patient off Bipap. Pt on 10mg  Albuterol continuous. Report given to [facility], RRT at [facility].

## 2018-07-22 NOTE — Procedures (Signed)
Name: CHUMY MOMENT MRN: [id number] DOB: 07/09/1991   PROCEDURE NOTE  Procedure:  Endotracheal intubation.Patient self extubated   Indication:  Acute respiratory failure  Consent:  Consent was implied due to the emergency nature of the procedure.  Anesthesia:  A total of 10 mg of Etomidate was given intravenously.  Procedure summary:  Appropriate equipment was assembled. The patient was identified as Laura [name] and safety timeout was performed. The patient was placed supine, with head in sniffing position. After adequate level of anesthesia was achieved, a 4 blade was inserted into the oropharynx and the vocal cords were visualized. A 7.5 endotracheal tube was inserted 23 cm difficulty and visualized going through the vocal cords. The stylette was removed and cuff inflated. Colorimetric change was noted on the CO2 meter. Breath sounds were heard over both lung fields equally. ETT was secured at 23 lip line.  Post procedure chest xray was ordered.  Complications:  No immediate complications were noted.  Hemodynamic parameters and oxygenation remained stable throughout the procedure.    Gillermina Phy, M.D. Pulmonary and Critical Care Medicine [facility] Pager: [phone]  07/22/2018, 7:02 PM

## 2018-07-22 NOTE — ED Notes (Signed)
ED Provider at bedside. 

## 2018-07-22 NOTE — ED Triage Notes (Addendum)
SOB since this am at 0200, out of nebs and inhaler . Cough, dry . Dx with Pneumonia and admitted on 06/16/18

## 2018-07-22 NOTE — Progress Notes (Signed)
Patient self extubated, retubed by MD, medications given for comfort and pain.

## 2018-07-22 NOTE — ED Notes (Signed)
Portable CXR done.

## 2018-07-22 NOTE — Procedures (Signed)
Intubation Procedure Note TAMARAH HALLEY [id number] [birth year]  Procedure: Intubation Indications: Respiratory insufficiency  Procedure Details Consent: Risks of procedure as well as the alternatives and risks of each were explained to the (patient/caregiver).  Consent for procedure obtained. Time Out: Verified patient identification, verified procedure, site/side was marked, verified correct patient position, special equipment/implants available, medications/allergies/relevent history reviewed, required imaging and test results available.  Performed  Preoxygenation: 100% with BiPAP Premedication: fentanyl , lidocaine 60mg  BVM ventilation: easy Induction: propofol 70mg , rocuronium 50mg .  Intubation: Glidescope 3 single pass Grade 1 laryngoscopy. Inserted to 22cm at the teeth. 7.46mm ETT.   Evaluation Hemodynamic Status: BP stable throughout; O2 sats: stable throughout Patient's Current Condition: stable Complications: No apparent complications Patient did tolerate procedure well. Chest X-ray ordered to verify placement.  CXR: tube position acceptable.   Evanny Ellerbe 07/22/2018

## 2018-07-22 NOTE — ED Notes (Signed)
ED MD in to see, informed of K+2.8

## 2018-07-22 NOTE — ED Notes (Signed)
Report given to [facility] with [facility]. Attempted to give report to floor nurse but was unavailable

## 2018-07-22 NOTE — ED Notes (Signed)
States," I am feeling a little better"

## 2018-07-22 NOTE — ED Provider Notes (Signed)
[facility] EMERGENCY DEPARTMENT Provider Note   CSN: [id number] Arrival date & time: 07/22/18  8335     History   Chief Complaint Chief Complaint  Patient presents with  . Shortness of Breath    asthma    HPI Laura [name] is a 27 y.o. female.  27yo F w/ PMH including asthma who p/w SOB.  The patient woke up at 2 AM with wheezing and shortness of breath consistent with previous asthma exacerbations.  She ran out of her inhaler at home.  She reports a few days of dry cough associated with some sore throat and rhinorrhea.  No fevers and no sick contacts.  LEVEL 5 CAVEAT DUE TO RESPIRATORY DISTRESS  The history is provided by the patient.    Past Medical History:  Diagnosis Date  . Asthma     Patient Active Problem List   Diagnosis Date Noted  . Community acquired pneumonia 06/16/2018  . Nausea & vomiting 06/16/2018  . Lactic acidosis 06/16/2018  . Tachycardia 06/16/2018  . Hypokalemia 08/19/2017  . Asthma exacerbation 08/18/2017    History reviewed. No pertinent surgical history.   OB History    Gravida  1   Para      Term      Preterm      AB      Living        SAB      TAB      Ectopic      Multiple      Live Births               Home Medications    Prior to Admission medications   Medication Sig Start Date End Date Taking? Authorizing Provider  albuterol (PROVENTIL HFA;VENTOLIN HFA) 108 (90 Base) MCG/ACT inhaler Inhale 1-2 puffs into the lungs every 6 (six) hours as needed for wheezing or shortness of breath. 08/20/17  Yes Dorothea Ogle, MD  FLOVENT HFA 110 MCG/ACT inhaler INL 2 PUFFS PO BID PRN FOR SOB AND WHEEZING 06/24/17  Yes [provider]  fluticasone (FLONASE) 50 MCG/ACT nasal spray Place 1 spray into both nostrils daily. 12/22/17  Yes Murray, Alyssa B, PA-C  ipratropium-albuterol (DUONEB) 0.5-2.5 (3) MG/3ML SOLN Take 3 mLs by nebulization every 6 (six) hours as needed (sob and wheezing). 08/20/17  Yes Dorothea Ogle, MD  montelukast (SINGULAIR) 10 MG tablet Take 10 mg by mouth daily. 05/04/18  Yes [provider]  cefdinir (OMNICEF) 300 MG capsule Take 1 capsule (300 mg total) by mouth 2 (two) times daily. 06/16/18   Lenox Ponds, MD  guaiFENesin (ROBITUSSIN) 100 MG/5ML SOLN Take 5 mLs (100 mg total) by mouth every 4 (four) hours as needed for cough or to loosen phlegm. 06/16/18   Lenox Ponds, MD    Family History Family History  Problem Relation Age of Onset  . Asthma Father   . Asthma Maternal Grandmother     Social History Social History   Tobacco Use  . Smoking status: Passive Smoke Exposure - Never Smoker  . Smokeless tobacco: Never Used  Substance Use Topics  . Alcohol use: No  . Drug use: No     Allergies   Patient has no known allergies.   Review of Systems Review of Systems  Unable to perform ROS: Severe respiratory distress     Physical Exam Updated Vital Signs BP 122/76   Pulse (!) 138   Temp 98 F (36.7 C) (Oral)  Resp (!) 32   Ht 5\' 4"  (1.626 m)   Wt 70.3 kg   LMP 07/10/2018   SpO2 97%   BMI 26.61 kg/m   Physical Exam  Constitutional: She is oriented to person, place, and time. She appears well-developed and well-nourished. She appears distressed.  HENT:  Head: Normocephalic and atraumatic.  Eyes: Conjunctivae are normal.  Neck: Neck supple.  Cardiovascular: Normal rate, regular rhythm and normal heart sounds.  No murmur heard. Pulmonary/Chest: Accessory muscle usage present. Tachypnea noted. She is in respiratory distress. She has decreased breath sounds. She has wheezes.  Abdominal: Soft. Bowel sounds are normal. She exhibits no distension. There is no tenderness.  Musculoskeletal: She exhibits no edema.  Neurological: She is alert and oriented to person, place, and time.  Fluent speech  Skin: Skin is warm and dry.  Psychiatric: She has a normal mood and affect. Judgment normal.  Nursing note and vitals  reviewed.    ED Treatments / Results  Labs (all labs ordered are listed, but only abnormal results are displayed) Labs Reviewed  COMPREHENSIVE METABOLIC PANEL - Abnormal; Notable for the following components:      Result Value   Potassium 2.8 (*)    CO2 20 (*)    Glucose, Bld 182 (*)    Calcium 8.7 (*)    All other components within normal limits  CBC - Abnormal; Notable for the following components:   WBC 15.6 (*)    RBC 5.12 (*)    MCV 70.9 (*)    MCH 23.4 (*)    All other components within normal limits  I-STAT VENOUS BLOOD GAS, ED - Abnormal; Notable for the following components:   pCO2, Ven 34.0 (*)    pO2, Ven 76.0 (*)    Bicarbonate 18.2 (*)    TCO2 19 (*)    Acid-base deficit 7.0 (*)    All other components within normal limits  RESPIRATORY PANEL BY PCR  MRSA PCR SCREENING  D-DIMER, QUANTITATIVE (NOT AT [facility])  PREGNANCY, URINE  BLOOD GAS, ARTERIAL  BASIC METABOLIC PANEL  MAGNESIUM  PHOSPHORUS  LACTIC ACID, PLASMA  LACTIC ACID, PLASMA  CBC  STREP PNEUMONIAE URINARY ANTIGEN  LEGIONELLA PNEUMOPHILA SEROGP 1 UR AG  TRIGLYCERIDES    EKG None  Radiology Dg Chest Port 1 View  Result Date: 07/22/2018 CLINICAL DATA:  Dyspnea, asthma, cough EXAM: PORTABLE CHEST 1 VIEW COMPARISON:  06/15/2018 chest radiograph. FINDINGS: Stable cardiomediastinal silhouette with normal heart size. No pneumothorax. No pleural effusion. No pulmonary edema. No acute consolidative airspace disease. No residual left lung consolidation. IMPRESSION: No active cardiopulmonary disease. Previously visualized left pleural effusion and left lung opacities have resolved. Electronically Signed   By: Delbert Phenix M.D.   On: 07/22/2018 09:46    Procedures .Critical Care Performed by: Laurence Spates, MD Authorized by: Laurence Spates, MD   Critical care provider statement:    Critical care time (minutes):  60   Critical care time was exclusive of:  Separately billable procedures and  treating other patients   Critical care was necessary to treat or prevent imminent or life-threatening deterioration of the following conditions:  Respiratory failure   Critical care was time spent personally by me on the following activities:  Development of treatment plan with patient or surrogate, discussions with consultants, evaluation of patient's response to treatment, examination of patient, obtaining history from patient or surrogate, ordering and performing treatments and interventions, ordering and review of laboratory studies, ordering and review of radiographic  studies, re-evaluation of patient's condition and review of old charts   (including critical care time)  Medications Ordered in ED Medications  magnesium sulfate IVPB 2 g 50 mL (2 g Intravenous New Bag/Given 07/22/18 0923)  0.9 %  sodium chloride infusion (250 mLs Intravenous New Bag/Given 07/22/18 0920)  ipratropium (ATROVENT) 0.02 % nebulizer solution (0.5 mg  Given 07/22/18 0857)  albuterol (PROVENTIL, VENTOLIN) (5 MG/ML) 0.5% continuous inhalation solution (3 mg/hr  Given 07/22/18 0857)  methylPREDNISolone sodium succinate (SOLU-MEDROL) 125 mg/2 mL injection 125 mg (125 mg Intravenous Given 07/22/18 0908)  ondansetron (ZOFRAN) 4 MG/2ML injection (4 mg  Given 07/22/18 0920)     Initial Impression / Assessment and Plan / ED Course  I have reviewed the triage vital signs and the nursing notes.  Pertinent labs & imaging results that were available during my care of the patient were reviewed by me and considered in my medical decision making (see chart for details).     She was in moderate respiratory distress on arrival, mild hypoxia.  Placed on continuous albuterol, given Solu-Medrol and magnesium.  Chest x-ray clear, previous pneumonia has resolved.  10:10 AM After 1 hour of continuous, she is somewhat improved but continues to be hypoxic on room air.  Given ongoing symptoms, will complete second hour of continuous  albuterol.  Obtain lab work which shows reassuring VBG, potassium 2.8 which is likely due to the amount of albuterol she has received in intracellular shifts, WBC 15.6, negative d-dimer making PE unlikely.   After 2 hours of continuous albuterol, the patient still had significant increased work of breathing and I placed her on BiPAP for improved oxygenation and improved work of breathing.  He was initially anxious with the mask but after a dose of Ativan she was able to tolerate it well.  O2 saturation mid 90s on FiO2 80%.  She later voiced some improvement in her symptoms on the BiPAP.  I discussed admission with pulmonary/critical care, Dr.Agarwala, given pt's tenuous respiratory status.  I do not feel that she has declined here and although she continues to have respiratory distress, she has been stable on BiPAP.  I have discussed my decision to hold off on intubation given that she will be difficult to control on the vent if intubated.Pt transferred via [facility] to [facility] for ICU admission.  Final Clinical Impressions(s) / ED Diagnoses   Final diagnoses:  None    ED Discharge Orders    None       Kelon Easom, Ambrose Finland, MD 07/22/18 1552

## 2018-07-22 NOTE — Plan of Care (Signed)
Received patient from [facility] with Asthma, c/o SOB, accessory muscles in use, patient stating that she can not breathe.  Emergent intubation successful, patient tolerating and remains on Vent at this time, propofol and fentanyl for sedation.  Family called and made aware of patients status. Will continue to monitor closely

## 2018-07-22 NOTE — H&P (Signed)
Laura [name]  ZOX:[id number] DOB: 08-31-1991 DOA: 07/22/2018 PCP: Patient, No Pcp Per    LOS: 0 days   Reason for Consult / Chief Complaint:  Status asthmaticus.  Consulting MD and date of consult:  [facility] - ED [facility]  HPI/summary of hospital stay:  Limited information obtained from conversation with ED provider and patient prior to intubation.   The patient is a 27 year old woman who presented to [facility] with worsening dyspnea since 0200. Respiratory status continued to decline eventually requiring BiPAP. ICU transfer requested when patient had failed to improve with nearly 2h of continuous nebulization.  History of asthma. Patient was not able to clearly state her triggers. No prior intubations or ICU admissions. Recently treated for pneumonia, received steroids at that time. No recent specialist care for asthma and received most of her care recently via ED. Non-smoker. Works at [employer].   Subjective  Prior to intubation patient stated that she felt she was beginning to tire out.  Assessment & Plan:   -Acute mixed respiratory failure due to status asthmaticus.  Intubated for increasing work of breathing despite trial of NIV.  Continue mechanical ventilation prioritizing correction of hypoxia. Permissive hypercapnea to allow sufficient exhalation time to prevent dyssynchrony, breath stacking and barotrauma.  - Status asthmaticus.  Suspect poor asthma control at baseline. Improved air entry at present. Continue bronchodilators and steroids and attempt to decrease bronchodilator use. CXR shows no infiltrate, so hold on antibiotics.  Propofol and fentanyl for sedation, limit anxiety/coughing induced bronchospasm. Case management post extubation to evaluate barriers to care.  - Hypokalemia.  Due to bronchodilator use. Replete and follow carefully as may contribute to respiratory muscle weakness.   Best Practice / Goals of Care / Disposition.   DVT prophylaxis: lovenox GI  prophylaxis: famotidine Diet: NPO for now. Mobility: once ready for extubation. Code Status: full  Family Communication: no family present.  Disposition / Summary of Today's Plan 07/22/18   Full ventilatory support overnight and commence weaning in am once bronchospasm improves.  Consultants: date of consult/date signed off (if applicable)/final recs   PCCM  Procedures: Intubated 9/4  Significant Diagnostic Tests:  CXR 9/4:  IMPRESSION: 1. Well-positioned endotracheal tube and nasal/orogastric tube. 2. Presumed right subclavian central venous line is poorly visualized. Tip is not defined on this study, but at least enters the upper superior vena cava. 3. No pneumothorax. 4. New airspace opacity has developed at the left lung base. Suspect this is a combination of atelectasis and pneumonia.  Micro Data: Serology pending.  Antimicrobials:  None    Objective    Examination: General: Well nourished. In respiratory distress and increasingly somnolent. HENT: Dry mucus membranes. Tracheal midline. Lungs: Limited air entry with wheezing. Cardiovascular: JVP not elevated. S1, S2 normal with no murmur. No edema. Abdomen: Scaphoid soft. Extremities: No edema. Neuro: Somnolent with no focal deficits. GU: Purewick in place.  Blood pressure (!) 101/43, pulse (!) 135, temperature 98 F (36.7 C), temperature source Oral, resp. rate (!) 36, height 5\' 4"  (1.626 m), weight 70.3 kg, last menstrual period 07/10/2018, SpO2 100 %.    Vent Mode: PRVC FiO2 (%):  [100 %] 100 % Set Rate:  [12 bmp-15 bmp] 15 bmp Vt Set:  [440 mL] 440 mL PEEP:  [5 cmH20] 5 cmH20 Plateau Pressure:  [23 cmH20] 23 cmH20   Intake/Output Summary (Last 24 hours) at 07/22/2018 1530 Last data filed at 07/22/2018 1225 Gross per 24 hour  Intake 80 ml  Output -  Net 80 ml   Filed Weights   07/22/18 0851  Weight: 70.3 kg     Labs    CBC: Recent Labs  Lab 07/22/18 1148  WBC 15.6*  HGB 12.0  HCT 36.3   MCV 70.9*  PLT 373   Basic Metabolic Panel: Recent Labs  Lab 07/22/18 1148  NA 138  K 2.8*  CL 105  CO2 20*  GLUCOSE 182*  BUN 10  CREATININE 0.69  CALCIUM 8.7*   GFR: Estimated Creatinine Clearance: 103.4 mL/min (by C-G formula based on SCr of 0.69 mg/dL). Recent Labs  Lab 07/22/18 1148  WBC 15.6*   Liver Function Tests: Recent Labs  Lab 07/22/18 1148  AST 25  ALT 15  ALKPHOS 46  BILITOT 0.8  PROT 7.5  ALBUMIN 4.2   No results for input(s): LIPASE, AMYLASE in the last 168 hours. No results for input(s): AMMONIA in the last 168 hours. ABG    Component Value Date/Time   HCO3 18.2 (L) 07/22/2018 1157   TCO2 19 (L) 07/22/2018 1157   ACIDBASEDEF 7.0 (H) 07/22/2018 1157   O2SAT 95.0 07/22/2018 1157    Coagulation Profile: No results for input(s): INR, PROTIME in the last 168 hours. Cardiac Enzymes: No results for input(s): CKTOTAL, CKMB, CKMBINDEX, TROPONINI in the last 168 hours. HbA1C: No results found for: HGBA1C CBG: No results for input(s): GLUCAP in the last 168 hours.   Review of Systems:   Unable to obtain due to tenuous respiratory status.  Past medical history  She,  has a past medical history of Asthma.   Surgical History   History reviewed. No pertinent surgical history.   Social History   Social History   Socioeconomic History  . Marital status: Single    Spouse name: Not on file  . Number of children: Not on file  . Years of education: Not on file  . Highest education level: Not on file  Occupational History  . 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 Financial resource strain: Not on file  . Food insecurity:    Worry: Not on file    Inability: Not on file  . Transportation needs:    Medical: Not on file    Non-medical: Not on file  Tobacco Use  . Smoking status: Passive Smoke Exposure - Never Smoker  . Smokeless tobacco: Never Used  Substance and Sexual Activity  . Alcohol use:  No  . Drug use: No  . Sexual activity: Not on file  Lifestyle  . Physical activity:    Days per week: Not on file    Minutes per session: Not on file  . Stress: Not on file  Relationships  . Social connections:    Talks on phone: Not on file    Gets together: Not on file    Attends religious service: Not on file    Active member of club or organization: Not on file    Attends meetings of clubs or organizations: Not on file    Relationship status: Not on file  . Intimate partner violence:    Fear of current or ex partner: Not on file    Emotionally abused: Not on file    Physically abused: Not on file    Forced sexual activity: Not on file  Other Topics Concern  . Not on file  Social History Narrative  . Not on file  ,  reports that she is a non-smoker but has  been exposed to tobacco smoke. She has never used smokeless tobacco. She reports that she does not drink alcohol or use drugs.   Family history   Her family history includes Asthma in her father and maternal grandmother.   Allergies No Known Allergies  Home meds  Prior to Admission medications   Medication Sig Start Date End Date Taking? Authorizing Provider  albuterol (PROVENTIL HFA;VENTOLIN HFA) 108 (90 Base) MCG/ACT inhaler Inhale 1-2 puffs into the lungs every 6 (six) hours as needed for wheezing or shortness of breath. 08/20/17  Yes Dorothea Ogle, MD  FLOVENT HFA 110 MCG/ACT inhaler INL 2 PUFFS PO BID PRN FOR SOB AND WHEEZING 06/24/17  Yes [provider]  fluticasone (FLONASE) 50 MCG/ACT nasal spray Place 1 spray into both nostrils daily. 12/22/17  Yes Murray, Alyssa B, PA-C  ipratropium-albuterol (DUONEB) 0.5-2.5 (3) MG/3ML SOLN Take 3 mLs by nebulization every 6 (six) hours as needed (sob and wheezing). 08/20/17  Yes Dorothea Ogle, MD  montelukast (SINGULAIR) 10 MG tablet Take 10 mg by mouth daily. 05/04/18  Yes [provider]  cefdinir (OMNICEF) 300 MG capsule Take 1 capsule (300 mg total) by mouth  2 (two) times daily. 06/16/18   Lenox Ponds, MD  guaiFENesin (ROBITUSSIN) 100 MG/5ML SOLN Take 5 mLs (100 mg total) by mouth every 4 (four) hours as needed for cough or to loosen phlegm. 06/16/18   Lenox Ponds, MD     CRITICAL CARE Performed by: Lynnell Catalan   Total critical care time: 50 minutes  Critical care time was exclusive of separately billable procedures and treating other patients.  Critical care was necessary to treat or prevent imminent or life-threatening deterioration.  Critical care was time spent personally by me on the following activities: development of treatment plan with patient and/or surrogate as well as nursing, discussions with consultants, evaluation of patient's response to treatment, examination of patient, obtaining history from patient or surrogate, ordering and performing treatments and interventions, ordering and review of laboratory studies, ordering and review of radiographic studies, pulse oximetry and re-evaluation of patient's condition.   Lynnell Catalan, MD [facility] ICU Physician [facility] [place] [facility]  Pager: [phone] Mobile: [phone] After hours: [phone].

## 2018-07-22 NOTE — Progress Notes (Signed)
Received patient and report from [facility].  Patient able to answer questions but having difficulty breathing.  Discussed possible need for intubation with patient, attempted to contact patients mother, will retry at a later time.   See Epic for full assessment, will continue to monitor.

## 2018-07-23 ENCOUNTER — Inpatient Hospital Stay (HOSPITAL_COMMUNITY): Payer: Self-pay

## 2018-07-23 DIAGNOSIS — J9601 Acute respiratory failure with hypoxia: Secondary | ICD-10-CM

## 2018-07-23 DIAGNOSIS — Z978 Presence of other specified devices: Secondary | ICD-10-CM

## 2018-07-23 LAB — BLOOD GAS, ARTERIAL
ACID-BASE DEFICIT: 1 mmol/L (ref 0.0–2.0)
Acid-base deficit: 1.2 mmol/L (ref 0.0–2.0)
BICARBONATE: 23.2 mmol/L (ref 20.0–28.0)
BICARBONATE: 24.3 mmol/L (ref 20.0–28.0)
Drawn by: 10006
Drawn by: 10006
FIO2: 60
FIO2: 60
LHR: 24 {breaths}/min
LHR: 24 {breaths}/min
MECHVT: 440 mL
O2 Saturation: 97.4 %
O2 Saturation: 98.6 %
PATIENT TEMPERATURE: 99
PCO2 ART: 49.6 mmHg — AB (ref 32.0–48.0)
PEEP/CPAP: 5 cmH2O
PEEP: 5 cmH2O
PO2 ART: 123 mmHg — AB (ref 83.0–108.0)
PO2 ART: 100 mmHg (ref 83.0–108.0)
Patient temperature: 98.6
VT: 440 mL
pCO2 arterial: 40 mmHg (ref 32.0–48.0)
pH, Arterial: 7.313 — ABNORMAL LOW (ref 7.350–7.450)
pH, Arterial: 7.381 (ref 7.350–7.450)

## 2018-07-23 LAB — LEGIONELLA PNEUMOPHILA SEROGP 1 UR AG: L. PNEUMOPHILA SEROGP 1 UR AG: NEGATIVE

## 2018-07-23 LAB — BASIC METABOLIC PANEL
Anion gap: 9 (ref 5–15)
BUN: 6 mg/dL (ref 6–20)
CALCIUM: 9 mg/dL (ref 8.9–10.3)
CO2: 23 mmol/L (ref 22–32)
CREATININE: 0.72 mg/dL (ref 0.44–1.00)
Chloride: 107 mmol/L (ref 98–111)
GFR calc non Af Amer: >60 mL/min (ref >60)
Glucose, Bld: 142 mg/dL — ABNORMAL HIGH (ref 70–99)
Potassium: 4.2 mmol/L (ref 3.5–5.1)
SODIUM: 139 mmol/L (ref 135–145)

## 2018-07-23 LAB — CBC
HCT: 35.9 % — ABNORMAL LOW (ref 36.0–46.0)
Hemoglobin: 11.1 g/dL — ABNORMAL LOW (ref 12.0–15.0)
MCH: 23.2 pg — ABNORMAL LOW (ref 26.0–34.0)
MCHC: 30.9 g/dL (ref 30.0–36.0)
MCV: 74.9 fL — ABNORMAL LOW (ref 78.0–100.0)
Platelets: 351 10*3/uL (ref 150–400)
RBC: 4.79 MIL/uL (ref 3.87–5.11)
RDW: 15.3 % (ref 11.5–15.5)
WBC: 20.2 10*3/uL — ABNORMAL HIGH (ref 4.0–10.5)

## 2018-07-23 LAB — I-STAT CG4 LACTIC ACID, ED: LACTIC ACID, VENOUS: 3.96 mmol/L — AB (ref 0.5–1.9)

## 2018-07-23 LAB — LACTIC ACID, PLASMA: Lactic Acid, Venous: 1 mmol/L (ref 0.5–1.9)

## 2018-07-23 LAB — GLUCOSE, CAPILLARY
GLUCOSE-CAPILLARY: 150 mg/dL — AB (ref 70–99)
GLUCOSE-CAPILLARY: 119 mg/dL — AB (ref 70–99)
Glucose-Capillary: 122 mg/dL — ABNORMAL HIGH (ref 70–99)
Glucose-Capillary: 110 mg/dL — ABNORMAL HIGH (ref 70–99)
Glucose-Capillary: 113 mg/dL — ABNORMAL HIGH (ref 70–99)

## 2018-07-23 LAB — MAGNESIUM: Magnesium: 2.4 mg/dL (ref 1.7–2.4)

## 2018-07-23 LAB — PHOSPHORUS: PHOSPHORUS: 2.4 mg/dL — AB (ref 2.5–4.6)

## 2018-07-23 MED ORDER — BUDESONIDE 0.5 MG/2ML IN SUSP
0.5000 mg | Freq: Two times a day (BID) | RESPIRATORY_TRACT | Status: DC
  Administered 2018-07-23 – 2018-07-26 (×7): 0.5 mg via RESPIRATORY_TRACT
  Filled 2018-07-23 (×7): qty 2

## 2018-07-23 MED ORDER — FAMOTIDINE IN NACL 20-0.9 MG/50ML-% IV SOLN: 20.0000 mg | Freq: Two times a day (BID) | INTRAVENOUS | Status: DC

## 2018-07-23 MED ORDER — METHYLPREDNISOLONE SODIUM SUCC 125 MG IJ SOLR
80.0000 mg | Freq: Four times a day (QID) | INTRAMUSCULAR | Status: DC
  Administered 2018-07-23 – 2018-07-25 (×8): 80 mg via INTRAVENOUS
  Filled 2018-07-23 (×9): qty 2

## 2018-07-23 NOTE — Progress Notes (Signed)
CSW received consult regarding medication needs. Please consult RNCM for medication assistance.   CSW signing off.  Osborne Casco Herb Beltre LCSW [phone]

## 2018-07-23 NOTE — Progress Notes (Signed)
PULMONARY / CRITICAL CARE MEDICINE   Name: Laura [name] MRN: [id number] DOB: 05-Jul-1991    ADMISSION DATE:  07/22/2018 CONSULTATION DATE: July 22, 2018  REFERRING MD:  Clarene Duke - ED [facility]  CHIEF COMPLAINT: Severe shortness of breath  HISTORY OF PRESENT ILLNESS:   Limited information obtained from conversation with ED provider and patient prior to intubation.   The patient is a 27 year old woman who presented to [facility] with worsening dyspnea since 0200. Respiratory status continued to decline eventually requiring BiPAP. ICU transfer requested when patient had failed to improve with nearly 2h of continuous nebulization.  History of asthma. Patient was not able to clearly state her triggers. No prior intubations or ICU admissions. Recently treated for pneumonia, received steroids at that time. No recent specialist care for asthma and received most of her care recently via ED. Non-smoker. Works at [employer].  PAST MEDICAL HISTORY :  She  has a past medical history of Asthma.  PAST SURGICAL HISTORY: She  has no past surgical history on file.  No Known Allergies  No current facility-administered medications on file prior to encounter.    Current Outpatient Medications on File Prior to Encounter  Medication Sig  . albuterol (PROVENTIL HFA;VENTOLIN HFA) 108 (90 Base) MCG/ACT inhaler Inhale 1-2 puffs into the lungs every 6 (six) hours as needed for wheezing or shortness of breath.  . FLOVENT HFA 110 MCG/ACT inhaler INL 2 PUFFS PO BID PRN FOR SOB AND WHEEZING  . fluticasone (FLONASE) 50 MCG/ACT nasal spray Place 1 spray into both nostrils daily.  Marland Kitchen ipratropium-albuterol (DUONEB) 0.5-2.5 (3) MG/3ML SOLN Take 3 mLs by nebulization every 6 (six) hours as needed (sob and wheezing).  . montelukast (SINGULAIR) 10 MG tablet Take 10 mg by mouth daily.  . cefdinir (OMNICEF) 300 MG capsule Take 1 capsule (300 mg total) by mouth 2 (two) times daily.  Marland Kitchen guaiFENesin (ROBITUSSIN) 100 MG/5ML SOLN Take  5 mLs (100 mg total) by mouth every 4 (four) hours as needed for cough or to loosen phlegm.    FAMILY HISTORY:  Her She indicated that the status of her father is unknown. She indicated that the status of her maternal grandmother is unknown.   SOCIAL HISTORY: She  reports that she is a non-smoker but has been exposed to tobacco smoke. She has never used smokeless tobacco. She reports that she does not drink alcohol or use drugs.  REVIEW OF SYSTEMS:   Unable to obtain due to patient's conditions  SUBJECTIVE:  Unable to obtain due to patient's conditions intubated and sedated.  VITAL SIGNS: BP 123/71 (BP Location: Right Arm)   Pulse 88   Temp 98.7 F (37.1 C) (Axillary)   Resp (!) 24   Ht 5\' 4"  (1.626 m)   Wt 76.1 kg   LMP 07/10/2018   SpO2 100%   BMI 28.80 kg/m   HEMODYNAMICS:    VENTILATOR SETTINGS: Vent Mode: PRVC FiO2 (%):  [40 %-100 %] 40 % Set Rate:  [12 bmp-24 bmp] 24 bmp Vt Set:  [440 mL] 440 mL PEEP:  [5 cmH20] 5 cmH20 Plateau Pressure:  [20 cmH20-27 cmH20] 20 cmH20  INTAKE / OUTPUT: I/O last 3 completed shifts: In: 2819.5 [I.V.:1346.3; IV Piggyback:1473.2] Out: 1850 [Urine:1350; Emesis/NG output:500]  PHYSICAL EXAMINATION: General: Well nourished. In respiratory distress and increasingly somnolent. HENT: Dry mucus membranes. Tracheal midline. Lungs: Limited air entry with wheezing. Cardiovascular: JVP not elevated. S1, S2 normal with no murmur. No edema. Abdomen: Scaphoid soft. Extremities: No edema.  Neuro: Somnolent with no focal deficits. GU: Purewick in place.   LABS:  BMET Recent Labs  Lab 07/22/18 1148 07/22/18 1544 07/23/18 0229  NA 138 140 139  K 2.8* 4.0 4.2  CL 105 107 107  CO2 20* 24 23  BUN 10 8 6   CREATININE 0.69 0.82 0.72  GLUCOSE 182* 177* 142*    Electrolytes Recent Labs  Lab 07/22/18 1148 07/22/18 1544 07/23/18 0229  CALCIUM 8.7* 8.5* 9.0  MG  --  2.3 2.4  PHOS  --  5.3* 2.4*    CBC Recent Labs  Lab  07/22/18 1148 07/22/18 1544 07/23/18 0229  WBC 15.6* 18.9* 20.2*  HGB 12.0 11.2* 11.1*  HCT 36.3 37.3 35.9*  PLT 373 377 351    Coag's No results for input(s): APTT, INR in the last 168 hours.  Sepsis Markers Recent Labs  Lab 07/22/18 1544 07/22/18 1831 07/22/18 2343  LATICACIDVEN 2.6* 3.0* 1.0    ABG Recent Labs  Lab 07/22/18 1613 07/22/18 2340 07/23/18 0505  PHART 7.196* 7.313* 7.381  PCO2ART 65.0* 49.6* 40.0  PO2ART 360.0* 100 123*    Liver Enzymes Recent Labs  Lab 07/22/18 1148  AST 25  ALT 15  ALKPHOS 46  BILITOT 0.8  ALBUMIN 4.2    Cardiac Enzymes No results for input(s): TROPONINI, PROBNP in the last 168 hours.  Glucose Recent Labs  Lab 07/22/18 1611 07/22/18 1952 07/22/18 2338 07/23/18 0403 07/23/18 0738  GLUCAP 167* 126* 135* 150* 122*    Imaging Dg Chest Port 1 View  Result Date: 07/22/2018 CLINICAL DATA:  27 year old female intubated for shortness of breath, respiratory failure. Self extubated, and reintubated. EXAM: PORTABLE CHEST 1 VIEW COMPARISON:  1521 hours today and earlier. FINDINGS: Portable AP semi upright view at 1903 hours. Endotracheal tube tip in good position between the level the clavicles and the carina. Enteric tube remains in place and courses to the left abdomen, tip not included. Increased lucency in the left upper quadrant with a veiling appearance suggesting dilated gas and fluid fills stomach. Paucity of other bowel gas in the abdomen. There is consolidation at the left lung base which is stable since 1521 hours today. Ventilation elsewhere is stable. Stable cardiac size and mediastinal contours. No pneumothorax. IMPRESSION: 1. ETT tip in good position. Enteric tube courses to the left abdomen, tip not included. 2. Query moderate to severe gas and fluid distension of the stomach. Recommend attempted NG tube decompression. 3. Continued left lung base consolidation which is new since 0902 hours today. Electronically Signed    By: Odessa Fleming M.D.   On: 07/22/2018 19:26   Dg Chest Port 1 View  Result Date: 07/22/2018 CLINICAL DATA:  Endotracheal tube placement. Provided history mentions central line placement. EXAM: PORTABLE CHEST 1 VIEW COMPARISON:  07/22/2018 at 9:02 a.m. FINDINGS: Endotracheal tube tip projects 2.1 cm above the carina, well positioned. Nasal/orogastric tube passes below the diaphragm well into the stomach. Faintly seen tube consistent with a right subclavian central line is noted. Tip is not visualized, but at least enters the upper superior vena cava. There is new opacity at the left lung base when compared to the earlier exam which may reflect a rapidly evolving pneumonia, atelectasis or a combination. Remainder of the lungs is clear. No pneumothorax. IMPRESSION: 1. Well-positioned endotracheal tube and nasal/orogastric tube. 2. Presumed right subclavian central venous line is poorly visualized. Tip is not defined on this study, but at least enters the upper superior vena cava. 3. No pneumothorax.  4. New airspace opacity has developed at the left lung base. Suspect this is a combination of atelectasis and pneumonia. Electronically Signed   By: Amie Portland M.D.   On: 07/22/2018 15:47   Dg Chest Port 1 View  Result Date: 07/22/2018 CLINICAL DATA:  Dyspnea, asthma, cough EXAM: PORTABLE CHEST 1 VIEW COMPARISON:  06/15/2018 chest radiograph. FINDINGS: Stable cardiomediastinal silhouette with normal heart size. No pneumothorax. No pleural effusion. No pulmonary edema. No acute consolidative airspace disease. No residual left lung consolidation. IMPRESSION: No active cardiopulmonary disease. Previously visualized left pleural effusion and left lung opacities have resolved. Electronically Signed   By: Delbert Phenix M.D.   On: 07/22/2018 09:46       SIGNIFICANT EVENTS: Self extubation on July 22, 2018 reintubated  LINES/TUBES: ET tube  DISCUSSION: Severe acute asthma exacerbation with severe respiratory  failure requiring intubation  ASSESSMENT / PLAN:  -Acute mixed respiratory failure due to status asthmaticus.  Intubated for increasing work of breathing despite trial of NIV.  Continue mechanical ventilation prioritizing correction of hypoxia. Permissive hypercapnea to allow sufficient exhalation time to prevent dyssynchrony, breath stacking and barotrauma.  - Status asthmaticus.  Suspect poor asthma control at baseline. Improved air entry at present. Continue bronchodilators and steroids and attempt to decrease bronchodilator use. CXR shows no infiltrate, so hold on antibiotics.  Propofol and fentanyl for sedation, limit anxiety/coughing induced bronchospasm. Case management post extubation to evaluate barriers to care.  - Hypokalemia.  Due to bronchodilator use. Replete and follow carefully as may contribute to respiratory muscle weakness.  Patient is a DVT GI prophylaxis increase steroids added Pulmicort.   Pulmonary and Critical Care Medicine [facility] Pager: [phone]  07/23/2018, 9:12 AM

## 2018-07-23 NOTE — Care Management Note (Addendum)
Case Management Note  Patient Details  Name: Laura [name] MRN: [id number] Date of Birth: 1991/03/12  Subjective/Objective:  27 year old female presented with worsening dyspnea.                 Action/Plan: Patient remains on vent and acutely ill at this time. CM aware of barriers to care and will continue to follow to assist with arranging a PCP appointment, medication assistance and assess for additional post transitional needs.  Expected Discharge Date:                  Expected Discharge Plan:     In-House Referral:  NA  Discharge planning Services  CM Consult  Post Acute Care Choice:  NA Choice offered to:  NA  DME Arranged:  N/A DME Agency:  NA  HH Arranged:  NA HH Agency:  NA  Status of Service:  In process, will continue to follow  If discussed at Long Length of Stay Meetings, dates discussed:    Additional Comments:  Colleen Can RN, BSN, NCM-BC, ACM-RN [phone] 07/23/2018, 12:39 PM

## 2018-07-23 NOTE — Progress Notes (Signed)
Called infection prevention to clarify need for droplet precautions. Respiratory panel negative for everything except for rhinovirus. Okay to d/c droplet precautions per infection prevention.

## 2018-07-24 ENCOUNTER — Inpatient Hospital Stay (HOSPITAL_COMMUNITY): Payer: Self-pay

## 2018-07-24 LAB — CBC WITH DIFFERENTIAL/PLATELET
ABS IMMATURE GRANULOCYTES: 0.2 10*3/uL — AB (ref 0.0–0.1)
BASOS ABS: 0 10*3/uL (ref 0.0–0.1)
BASOS PCT: 0 %
EOS ABS: 0 10*3/uL (ref 0.0–0.7)
EOS PCT: 0 %
HCT: 36.7 % (ref 36.0–46.0)
Hemoglobin: 11.2 g/dL — ABNORMAL LOW (ref 12.0–15.0)
Immature Granulocytes: 1 %
Lymphocytes Relative: 5 %
Lymphs Abs: 0.7 10*3/uL (ref 0.7–4.0)
MCH: 22.6 pg — AB (ref 26.0–34.0)
MCHC: 30.5 g/dL (ref 30.0–36.0)
MCV: 74.1 fL — ABNORMAL LOW (ref 78.0–100.0)
MONO ABS: 0.3 10*3/uL (ref 0.1–1.0)
Monocytes Relative: 2 %
NEUTROS ABS: 13.3 10*3/uL — AB (ref 1.7–7.7)
Neutrophils Relative %: 92 %
PLATELETS: 421 10*3/uL — AB (ref 150–400)
RBC: 4.95 MIL/uL (ref 3.87–5.11)
RDW: 15.8 % — ABNORMAL HIGH (ref 11.5–15.5)
WBC: 14.5 10*3/uL — ABNORMAL HIGH (ref 4.0–10.5)

## 2018-07-24 LAB — MAGNESIUM: Magnesium: 2.8 mg/dL — ABNORMAL HIGH (ref 1.7–2.4)

## 2018-07-24 LAB — COMPREHENSIVE METABOLIC PANEL
ALT: 16 U/L (ref 0–44)
AST: 19 U/L (ref 15–41)
Albumin: 3.4 g/dL — ABNORMAL LOW (ref 3.5–5.0)
Alkaline Phosphatase: 44 U/L (ref 38–126)
Anion gap: 11 (ref 5–15)
BUN: 11 mg/dL (ref 6–20)
CHLORIDE: 109 mmol/L (ref 98–111)
CO2: 21 mmol/L — ABNORMAL LOW (ref 22–32)
CREATININE: 0.65 mg/dL (ref 0.44–1.00)
Calcium: 9.1 mg/dL (ref 8.9–10.3)
GFR calc Af Amer: >60 mL/min (ref >60)
GFR calc non Af Amer: >60 mL/min (ref >60)
GLUCOSE: 122 mg/dL — AB (ref 70–99)
POTASSIUM: 4.1 mmol/L (ref 3.5–5.1)
SODIUM: 141 mmol/L (ref 135–145)
Total Bilirubin: 0.6 mg/dL (ref 0.3–1.2)
Total Protein: 6.2 g/dL — ABNORMAL LOW (ref 6.5–8.1)

## 2018-07-24 LAB — GLUCOSE, CAPILLARY
GLUCOSE-CAPILLARY: 104 mg/dL — AB (ref 70–99)
GLUCOSE-CAPILLARY: 109 mg/dL — AB (ref 70–99)
GLUCOSE-CAPILLARY: 111 mg/dL — AB (ref 70–99)
GLUCOSE-CAPILLARY: 117 mg/dL — AB (ref 70–99)
GLUCOSE-CAPILLARY: 98 mg/dL (ref 70–99)
Glucose-Capillary: 131 mg/dL — ABNORMAL HIGH (ref 70–99)
Glucose-Capillary: 106 mg/dL — ABNORMAL HIGH (ref 70–99)

## 2018-07-24 LAB — POCT I-STAT 3, ART BLOOD GAS (G3+)
ACID-BASE DEFICIT: 2 mmol/L (ref 0.0–2.0)
Bicarbonate: 22.1 mmol/L (ref 20.0–28.0)
O2 Saturation: 93 %
PH ART: 7.417 (ref 7.350–7.450)
TCO2: 23 mmol/L (ref 22–32)
pCO2 arterial: 34.1 mmHg (ref 32.0–48.0)
pO2, Arterial: 65 mmHg — ABNORMAL LOW (ref 83.0–108.0)

## 2018-07-24 LAB — PROTIME-INR
INR: 1.15
Prothrombin Time: 14.6 seconds (ref 11.4–15.2)

## 2018-07-24 LAB — APTT: APTT: 28 s (ref 24–36)

## 2018-07-24 MED ORDER — LIDOCAINE HCL (PF) 1 % IJ SOLN: 2.0000 mL | Freq: Once | INTRAMUSCULAR | Status: DC

## 2018-07-24 MED ORDER — PNEUMOCOCCAL VAC POLYVALENT 25 MCG/0.5ML IJ INJ
0.5000 mL | INJECTION | INTRAMUSCULAR | Status: AC
  Administered 2018-07-26: 0.5 mL via INTRAMUSCULAR
  Filled 2018-07-24: qty 0.5

## 2018-07-24 MED ORDER — ENOXAPARIN SODIUM 40 MG/0.4ML ~~LOC~~ SOLN
40.0000 mg | SUBCUTANEOUS | Status: DC
  Administered 2018-07-24 – 2018-07-25 (×2): 40 mg via SUBCUTANEOUS
  Filled 2018-07-24 (×2): qty 0.4

## 2018-07-24 MED ORDER — INFLUENZA VAC SPLIT QUAD 0.5 ML IM SUSY
0.5000 mL | PREFILLED_SYRINGE | INTRAMUSCULAR | Status: AC
  Administered 2018-07-26: 0.5 mL via INTRAMUSCULAR
  Filled 2018-07-24: qty 0.5

## 2018-07-24 NOTE — Procedures (Signed)
Extubation Procedure Note  Patient Details:   Name: Laura [name] DOB: August 31, 1991 MRN: [id number]   Airway Documentation:  Airway 7 mm (Active)  Secured at (cm) 23 cm 07/24/2018  8:09 AM  Measured From Lips 07/24/2018  8:09 AM  Secured Location Left 07/24/2018  8:09 AM  Secured By [employer] 07/24/2018  8:09 AM  Tube Holder Repositioned Yes 07/24/2018  8:09 AM  Cuff Pressure (cm H2O) 24 cm H2O 07/24/2018  8:09 AM  Site Condition Cool;Dry 07/24/2018  8:09 AM   Vent end date: 07/24/18 Vent end time: 0920   Evaluation  O2 sats: currently acceptable: fell to 85% post extubation, o2 increased to 6lpm N/C. o2 sats currently 93% Complications: No apparent complications Patient did tolerate procedure well. Bilateral Breath Sounds: Expiratory wheezes   Yes: patient was able to breathe around the ETT pre extubation and talk post extubation.   Laura [name] 07/24/2018, 9:33 AM

## 2018-07-24 NOTE — Progress Notes (Signed)
Bedside swallow screen performed. Patient reports feeling residual food left over in throat after the applesauce and cracker; tolerated ice and water without difficulty.

## 2018-07-24 NOTE — Progress Notes (Addendum)
PULMONARY / CRITICAL CARE MEDICINE   Name: Laura [name] MRN: [id number] DOB: 1991-08-05    ADMISSION DATE:  07/22/2018 CONSULTATION DATE: July 22, 2018  REFERRING MD:  Clarene Duke - ED [facility]  CHIEF COMPLAINT: Severe shortness of breath  HISTORY OF PRESENT ILLNESS:   Limited information obtained from conversation with ED provider and patient prior to intubation.   The patient is a 27 year old woman who presented to [facility] with worsening dyspnea since 0200 on 9/4. Respiratory status continued to decline eventually requiring BiPAP. ICU transfer requested when patient had failed to improve with nearly 2h of continuous nebulization.  History of asthma. Patient was not able to clearly state her triggers. No prior intubations or ICU admissions. Recently treated for pneumonia, received steroids at that time.+ for RSV this admission No recent specialist care for asthma and received most of her care recently via ED. Non-smoker. Works at [employer].  SUBJECTIVE:  Remains intubated and sedated Gives ok sign when asked questions  VITAL SIGNS: BP 125/82 (BP Location: Right Arm)   Pulse (!) 54   Temp 98 F (36.7 C) (Oral)   Resp (!) 24   Ht 5\' 4"  (1.626 m)   Wt 76.7 kg   LMP 07/10/2018   SpO2 100%   BMI 29.02 kg/m   HEMODYNAMICS:    VENTILATOR SETTINGS: Vent Mode: PRVC FiO2 (%):  [40 %] 40 % Set Rate:  [24 bmp] 24 bmp Vt Set:  [440 mL] 440 mL PEEP:  [5 cmH20] 5 cmH20 Plateau Pressure:  [20 cmH20-21 cmH20] 21 cmH20  INTAKE / OUTPUT: I/O last 3 completed shifts: In: 3794 [I.V.:3164; NG/GT:30; IV Piggyback:600] Out: 2250 [Urine:2000; Emesis/NG output:250]  PHYSICAL EXAMINATION: General: Intubated and sedated on propofol and fentanyl, Full vent support  HENT: Copious oral secretions Tracheal midline., No LAD, ETT OG tube Lungs: Bilateral chest excursion, few rhonchi with cough, diminished per bases Cardiovascular:  S1, S2 , RRR,  with no rub,  Murmur, gallop. No edema. Abdomen:  Scaphoid soft.NT, ND, BS + Extremities: No edema, warm and dry, no obvious deformities noted Neuro: Seated but following commands, MAE x 4, Nods appropriately GU: Purewick in place.   LABS:  BMET Recent Labs  Lab 07/22/18 1544 07/23/18 0229 07/24/18 0311  NA 140 139 141  K 4.0 4.2 4.1  CL 107 107 109  CO2 24 23 21*  BUN 8 6 11   CREATININE 0.82 0.72 0.65  GLUCOSE 177* 142* 122*    Electrolytes Recent Labs  Lab 07/22/18 1544 07/23/18 0229 07/24/18 0311  CALCIUM 8.5* 9.0 9.1  MG 2.3 2.4 2.8*  PHOS 5.3* 2.4*  --     CBC Recent Labs  Lab 07/22/18 1544 07/23/18 0229 07/24/18 0311  WBC 18.9* 20.2* 14.5*  HGB 11.2* 11.1* 11.2*  HCT 37.3 35.9* 36.7  PLT 377 351 421*    Coag's Recent Labs  Lab 07/24/18 0311  APTT 28  INR 1.15    Sepsis Markers Recent Labs  Lab 07/22/18 1544 07/22/18 1831 07/22/18 2343  LATICACIDVEN 2.6* 3.0* 1.0    ABG Recent Labs  Lab 07/22/18 2340 07/23/18 0505 07/24/18 0357  PHART 7.313* 7.381 7.417  PCO2ART 49.6* 40.0 34.1  PO2ART 100 123* 65.0*    Liver Enzymes Recent Labs  Lab 07/22/18 1148 07/24/18 0311  AST 25 19  ALT 15 16  ALKPHOS 46 44  BILITOT 0.8 0.6  ALBUMIN 4.2 3.4*    Cardiac Enzymes No results for input(s): TROPONINI, PROBNP in the last 168 hours.  Glucose Recent Labs  Lab 07/23/18 1220 07/23/18 1619 07/23/18 2016 07/23/18 2356 07/24/18 0338 07/24/18 0845  GLUCAP 110* 113* 119* 109* 117* 104*    Imaging No results found. 07/23/2018>> Slightly improved LEFT basilar atelectasis/consolidation without other significant change.   SIGNIFICANT EVENTS: Self extubation on July 22, 2018 reintubated  LINES/TUBES: 9/4>>9/6: ET tube  DISCUSSION: Severe acute asthma exacerbation with severe respiratory failure requiring intubation in patient who is poorly compliant with maintenance therapy due to cost and having recently had her Medicaid stopped. She does have a pulmonologist in [place].  RVP + for RSV ASSESSMENT / PLAN:  -Acute mixed respiratory failure due to status asthmaticus. + RSV Failed BiPAP requiring intubation  Permissive hypercapnea to allow sufficient exhalation time to prevent dyssynchrony, breath stacking and barotrauma. Improved air movement 9/6,  No Wheezing noted on exam PIP 27 L/Min  Plan: Extubate 9/6 Titrate oxygen for saturations > 92% BiPAP prn if needed Trend CXR Continue scheduled and prn BD as ordered Aggressive Pulmonary Toilet IS Q1 while awake Mobilize OOB  - Status asthmaticus.  Suspect poor asthma control at baseline. Treated with BD, steroids Sedated to limit anxiety/coughing induced bronchospasm. Plan: Continue bronchodilators and steroids and attempt to decrease bronchodilator use.  CXR shows L atelectasis, will watch off ABX for now  Discontinue Sedation  Case management post extubation to evaluate barriers to care. Will need follow up for maintenance therapy with Pulmonary as OP  - Leukocytosis RVP + for Rhinovirus Atelectasis per CXR  Afebrile WBC Down-trending Plan: Trend CBC Monitor Fever and WBC Culture as is clinically indicated ABX if clinically indicated  - Hypokalemia.  2/2 to bronchodilator use.  Plan: Trend BMET Mag 9/7 am Replete electrolytes  Prn  Follow carefully as may contribute to respiratory muscle weakness.  Patient is on  DVT and GI prophylaxis ( Lovenox/ Pepcid) Continue  steroids and scheduled/ prn  BD  after extubation Titrate oxygen to  Maintain sats > 92% May use BiPAP as needed Case Management consult placed for medication assistance, assessment of barriers to care.  Bevelyn Ngo, AGACNP-BC Pulmonary and Critical Care Medicine [facility] Pager: [phone]  07/24/2018, 9:09 AM

## 2018-07-24 NOTE — Progress Notes (Signed)
eLink Physician-Brief Progress Note Patient Name: Laura [name] DOB: 02/25/1991 MRN: [id number]   Date of Service  07/24/2018  HPI/Events of Note  Pt is on the ventilator. He needs restraints for safety.  eICU Interventions  Bilateral soft wrist restraints reordered.        Thomasene Lot Ogan 07/24/2018, 12:20 AM

## 2018-07-24 NOTE — Progress Notes (Signed)
170 mL of IV Fentanyl wasted in sink. Witnessed by Hoover Browns, RN.

## 2018-07-24 NOTE — Care Management Note (Addendum)
Case Management Note  Patient Details  Name: Laura [name] MRN: [id number] Date of Birth: 09/23/1991  Subjective/Objective:  27 year old female presented with worsening dyspnea.                 Action/Plan: Patient remains on vent and acutely ill at this time. CM aware of barriers to care and will continue to follow to assist with arranging a PCP appointment, medication assistance and assess for additional post transitional needs.  Expected Discharge Date:                  Expected Discharge Plan:     In-House Referral:  NA  Discharge planning Services  CM Consult  Post Acute Care Choice:  NA Choice offered to:  NA  DME Arranged:  N/A DME Agency:  NA  HH Arranged:  NA HH Agency:  NA  Status of Service:  In process, will continue to follow  If discussed at Long Length of Stay Meetings, dates discussed:    Additional Comments: 07/24/18 @ 1251-Kambra Beachem RNCM- CM consult received to assist with outpatient pulmonary f/u appointment. Appointment arranged with: [facility] on 07/31/18 @ 1100 with Dr. Henrietta Hoover; appointment details updated on AVS. Will assist and continue to follow for further needs.    Colleen Can RN, BSN, NCM-BC, ACM-RN [phone] 07/24/2018, 12:50 PM

## 2018-07-24 NOTE — Evaluation (Signed)
Clinical/Bedside Swallow Evaluation Patient Details  Name: Laura [name] MRN: [id number] Date of Birth: Apr 03, 1991  Today's Date: 07/24/2018 Time: SLP Start Time (ACUTE ONLY): 1600 SLP Stop Time (ACUTE ONLY): 1618 SLP Time Calculation (min) (ACUTE ONLY): 18 min  Past Medical History:  Past Medical History:  Diagnosis Date  . Asthma    Past Surgical History: History reviewed. No pertinent surgical history. HPI:  27 y.o. female who presented to [facility] with worsening dyspnea. Severe acute asthma exacerbation with severe respiratory failure requiring intubation. Intubated <24 hours. Pt reported globus sensation with applesauce and cracker post-extubation.   Assessment / Plan / Recommendation Clinical Impression  Patient presents with oropharyngeal swallow which appears at bedside to be within functional limits with adequate airway protection. No overt signs of aspiration observed despite challenging with consecutive straw sips of thin liquids in excess of 3oz. Pt endorsed mild globus sensation at level of thyroid notch with puree, but not with other solids including dry cracker, mech soft chopped fruit. Globus sensation subsides with liquid wash. Recommend regular diet with thin liquids, meds whole with liquid. Educated pt re: alternating solids and liquids should this aid her symptoms. No further skilled ST needs identified. Will s/o.   SLP Visit Diagnosis: Dysphagia, unspecified (R13.10)    Aspiration Risk  No limitations    Diet Recommendation Regular;Thin liquid   Liquid Administration via: Cup;Straw Medication Administration: Whole meds with liquid Supervision: Patient able to self feed Compensations: Slow rate;Small sips/bites;Follow solids with liquid Postural Changes: Seated upright at 90 degrees    Other  Recommendations Oral Care Recommendations: Oral care BID   Follow up Recommendations None      Frequency and Duration            Prognosis Prognosis for Safe Diet  Advancement: Good      Swallow Study   General Date of Onset: 07/22/18 HPI: 27 y.o. female who presented to [facility] with worsening dyspnea. Severe acute asthma exacerbation with severe respiratory failure requiring intubation. Intubated <24 hours. Pt reported globus sensation with applesauce and cracker post-extubation. Type of Study: Bedside Swallow Evaluation Previous Swallow Assessment: none in chart Diet Prior to this Study: NPO Temperature Spikes Noted: No Respiratory Status: Room air History of Recent Intubation: Yes Length of Intubations (days): 1 days Date extubated: 07/24/18 Behavior/Cognition: Alert;Cooperative Oral Cavity Assessment: Within Functional Limits Oral Care Completed by SLP: No Oral Cavity - Dentition: Adequate natural dentition Vision: Functional for self-feeding Self-Feeding Abilities: Able to feed self Patient Positioning: Upright in bed Baseline Vocal Quality: Normal;Other (comment)(mildly hoarse: "I've been talking a lot") Volitional Cough: Strong Volitional Swallow: Able to elicit    Oral/Motor/Sensory Function Overall Oral Motor/Sensory Function: Within functional limits   Ice Chips Ice chips: Within functional limits Presentation: Spoon   Thin Liquid Thin Liquid: Within functional limits Presentation: Self Fed;Straw    Nectar Thick Nectar Thick Liquid: Not tested   Honey Thick Honey Thick Liquid: Not tested   Puree Puree: Within functional limits Presentation: Spoon Other Comments: mild globus sensation; subsides with liquid wash   Solid     Solid: Within functional limits     Rondel Baton, MS, CCC-SLP Speech-Language Pathologist Acute Rehabilitation Services Pager: [phone] Office: [phone]  Arlana Lindau 07/24/2018,4:49 PM

## 2018-07-24 NOTE — Care Management Note (Signed)
Case Management Note  Patient Details  Name: Laura [name] MRN: [id number] Date of Birth: 10-04-1991  Subjective/Objective:  27 year old female presented with worsening dyspnea.                 Action/Plan: Patient remains on vent and acutely ill at this time. CM aware of barriers to care and will continue to follow to assist with arranging a PCP appointment, medication assistance and assess for additional post transitional needs.  Expected Discharge Date:                  Expected Discharge Plan:     In-House Referral:  NA  Discharge planning Services  CM Consult, Follow-up appt scheduled, Medication Assistance  Post Acute Care Choice:  NA Choice offered to:  NA  DME Arranged:  N/A DME Agency:  NA  HH Arranged:  NA HH Agency:  NA  Status of Service:  In process, will continue to follow  If discussed at Long Length of Stay Meetings, dates discussed:    Additional Comments:  07/24/18 @ 1441-Brandis Wixted RNCM-Patient now extubated; CM met with patient/family to discuss transitional plans and informed patient of f/u appointment scheduled with [facility], with patient agreeable to location/date/time. Patient verbalized having a working nebulizer, and the ability to drive to [facility] for her f/u appointments. CM assessed for medication assistance; patient verbalized her ability to afford $4-10 meds from [place]. PCP appointment scheduled with [facility], 08/04/18 @ 0920; AVS updated. CM will continue to follow.   07/24/18 @ 1251-Harlem Bula RNCM- CM consult received to assist with outpatient pulmonary f/u appointment. Appointment arranged with: [facility] on 07/31/18 @ 66 with Dr. Carl Best; appointment details updated on AVS. Will assist and continue to follow for further needs.    Midge Minium RN, BSN, NCM-BC, ACM-RN [phone] 07/24/2018, 2:40 PM

## 2018-07-25 ENCOUNTER — Inpatient Hospital Stay (HOSPITAL_COMMUNITY): Payer: Self-pay

## 2018-07-25 LAB — CBC
HCT: 35.7 % — ABNORMAL LOW (ref 36.0–46.0)
Hemoglobin: 11.2 g/dL — ABNORMAL LOW (ref 12.0–15.0)
MCH: 22.9 pg — ABNORMAL LOW (ref 26.0–34.0)
MCHC: 31.4 g/dL (ref 30.0–36.0)
MCV: 73 fL — ABNORMAL LOW (ref 78.0–100.0)
PLATELETS: 449 10*3/uL — AB (ref 150–400)
RBC: 4.89 MIL/uL (ref 3.87–5.11)
RDW: 15.2 % (ref 11.5–15.5)
WBC: 14.8 10*3/uL — AB (ref 4.0–10.5)

## 2018-07-25 LAB — BASIC METABOLIC PANEL
ANION GAP: 10 (ref 5–15)
BUN: 20 mg/dL (ref 6–20)
CALCIUM: 9 mg/dL (ref 8.9–10.3)
CO2: 22 mmol/L (ref 22–32)
Chloride: 110 mmol/L (ref 98–111)
Creatinine, Ser: 0.82 mg/dL (ref 0.44–1.00)
GLUCOSE: 120 mg/dL — AB (ref 70–99)
POTASSIUM: 4.2 mmol/L (ref 3.5–5.1)
SODIUM: 142 mmol/L (ref 135–145)

## 2018-07-25 LAB — TRIGLYCERIDES: TRIGLYCERIDES: 105 mg/dL (ref <150)

## 2018-07-25 LAB — MAGNESIUM: Magnesium: 2.5 mg/dL — ABNORMAL HIGH (ref 1.7–2.4)

## 2018-07-25 LAB — GLUCOSE, CAPILLARY
GLUCOSE-CAPILLARY: 105 mg/dL — AB (ref 70–99)
GLUCOSE-CAPILLARY: 113 mg/dL — AB (ref 70–99)
GLUCOSE-CAPILLARY: 115 mg/dL — AB (ref 70–99)
Glucose-Capillary: 143 mg/dL — ABNORMAL HIGH (ref 70–99)

## 2018-07-25 MED ORDER — ALBUTEROL SULFATE (2.5 MG/3ML) 0.083% IN NEBU
2.5000 mg | INHALATION_SOLUTION | Freq: Three times a day (TID) | RESPIRATORY_TRACT | Status: DC
  Administered 2018-07-25 (×3): 2.5 mg via RESPIRATORY_TRACT
  Filled 2018-07-25 (×3): qty 3

## 2018-07-25 MED ORDER — METHYLPREDNISOLONE SODIUM SUCC 40 MG IJ SOLR
40.0000 mg | Freq: Two times a day (BID) | INTRAMUSCULAR | Status: DC
  Administered 2018-07-25 – 2018-07-26 (×2): 40 mg via INTRAVENOUS
  Filled 2018-07-25 (×2): qty 1

## 2018-07-25 MED ORDER — ARFORMOTEROL TARTRATE 15 MCG/2ML IN NEBU
15.0000 ug | INHALATION_SOLUTION | Freq: Two times a day (BID) | RESPIRATORY_TRACT | Status: DC
  Administered 2018-07-25 – 2018-07-26 (×3): 15 ug via RESPIRATORY_TRACT
  Filled 2018-07-25 (×3): qty 2

## 2018-07-25 NOTE — Progress Notes (Signed)
PROGRESS NOTE    TREMEKA [name]  ZOX:[id number] DOB: 12/02/1990 DOA: 07/22/2018 PCP: Patient, No Pcp Per    Brief Narrative:  28 year old with past medical history relevant for moderate to severely persistent asthma who came in with acute hypoxic respiratory failure due to asthma exacerbation requiring less than 24 hours of intubation.   Assessment & Plan:   Principal Problem:   Status asthmaticus Active Laura:   Acute respiratory failure with hypoxia (HCC)   Endotracheal tube present   Endotracheally intubated   #) Status asthmaticus: Patient apparently has had very poorly controlled asthma as an outpatient and has had difficulty affording her controller medications as well. - Continue budesonide and arformoterol twice daily -Continue 3 times daily albuterol and PRN -Case management consult and asthma teaching -Continue montelukast 10 mg daily -Wean IV methylprednisolone to 40 mg every 12  Fluids: Tolerating p.o. Electrodes: Monitor and supplement Nutrition: Regular diet  Prophylaxis: Enoxaparin  Disposition: Pending resolution of wheezing  Full code   Consultants:   PCCM  Procedures:  Intubation 07/23/2018 to 07/24/2018  Antimicrobials:   [name]   Subjective: Patient reports she is improving but expresses significant frustration with her outpatient management of her asthma.  She reports that she is using her rescue inhaler at home basically every 4 hours as well as a nebulizer.  She says that she does have a spacer however she does not report that it helps any.  Objective: Vitals:   07/25/18 0500 07/25/18 0822 07/25/18 0822 07/25/18 0826  BP:   132/90   Pulse:   65   Resp:      Temp:   98.4 F (36.9 C)   TempSrc:   Oral   SpO2:  93% 93% 93%  Weight: 74.1 kg     Height:        Intake/Output Summary (Last 24 hours) at 07/25/2018 0948 Last data filed at 07/25/2018 0600 Gross per 24 hour  Intake 1193.55 ml  Output 1220 ml  Net -26.45 ml   Filed  Weights   07/23/18 0400 07/24/18 0440 07/25/18 0500  Weight: 76.1 kg 76.7 kg 74.1 kg    Examination:  General exam: Appears calm and comfortable  Respiratory system: Mildly increased work of breathing, diffuse expiratory wheezes, prolonged expiratory phase, scattered rhonchi, no crackles Cardiovascular system: Regular rate and rhythm, no murmurs Gastrointestinal system: Abdomen is nondistended, soft and nontender. No organomegaly or masses felt. Normal bowel sounds heard. Central nervous system: Alert and oriented. No focal neurological deficits. Extremities: No lower extremity edema Skin: No rashes over visible skin Psychiatry: Judgement and insight appear normal. Mood & affect appropriate.     Data Reviewed: I have personally reviewed following labs and imaging studies  CBC: Recent Labs  Lab 07/22/18 1148 07/22/18 1544 07/23/18 0229 07/24/18 0311 07/25/18 0322  WBC 15.6* 18.9* 20.2* 14.5* 14.8*  NEUTROABS  --   --   --  13.3*  --   HGB 12.0 11.2* 11.1* 11.2* 11.2*  HCT 36.3 37.3 35.9* 36.7 35.7*  MCV 70.9* 75.8* 74.9* 74.1* 73.0*  PLT 373 377 351 421* 449*   Basic Metabolic Panel: Recent Labs  Lab 07/22/18 1148 07/22/18 1544 07/23/18 0229 07/24/18 0311 07/25/18 0322  NA 138 140 139 141 142  K 2.8* 4.0 4.2 4.1 4.2  CL 105 107 107 109 110  CO2 20* 24 23 21* 22  GLUCOSE 182* 177* 142* 122* 120*  BUN 10 8 6 11 20   CREATININE 0.69 0.82 0.72 0.65 0.82  CALCIUM 8.7* 8.5* 9.0 9.1 9.0  MG  --  2.3 2.4 2.8* 2.5*  PHOS  --  5.3* 2.4*  --   --    GFR: Estimated Creatinine Clearance: 103.5 mL/min (by C-G formula based on SCr of 0.82 mg/dL). Liver Function Tests: Recent Labs  Lab 07/22/18 1148 07/24/18 0311  AST 25 19  ALT 15 16  ALKPHOS 46 44  BILITOT 0.8 0.6  PROT 7.5 6.2*  ALBUMIN 4.2 3.4*   No results for input(s): LIPASE, AMYLASE in the last 168 hours. No results for input(s): AMMONIA in the last 168 hours. Coagulation Profile: Recent Labs  Lab  07/24/18 0311  INR 1.15   Cardiac Enzymes: No results for input(s): CKTOTAL, CKMB, CKMBINDEX, TROPONINI in the last 168 hours. BNP (last 3 results) No results for input(s): PROBNP in the last 8760 hours. HbA1C: No results for input(s): HGBA1C in the last 72 hours. CBG: Recent Labs  Lab 07/24/18 1621 07/24/18 2106 07/25/18 0015 07/25/18 0400 07/25/18 0819  GLUCAP 98 111* 115* 105* 113*   Lipid Profile: Recent Labs    07/22/18 1544  TRIG 13   Thyroid Function Tests: No results for input(s): TSH, T4TOTAL, FREET4, T3FREE, THYROIDAB in the last 72 hours. Anemia Panel: No results for input(s): VITAMINB12, FOLATE, FERRITIN, TIBC, IRON, RETICCTPCT in the last 72 hours. Sepsis Labs: Recent Labs  Lab 07/22/18 1153 07/22/18 1544 07/22/18 1831 07/22/18 2343  LATICACIDVEN 3.96* 2.6* 3.0* 1.0    Recent Results (from the past 240 hour(s))  Respiratory Panel by PCR     Status: Abnormal   Collection Time: 07/22/18 12:13 PM  Result Value Ref Range Status   Adenovirus NOT DETECTED NOT DETECTED Final   Coronavirus 229E NOT DETECTED NOT DETECTED Final   Coronavirus HKU1 NOT DETECTED NOT DETECTED Final   Coronavirus NL63 NOT DETECTED NOT DETECTED Final   Coronavirus OC43 NOT DETECTED NOT DETECTED Final   Metapneumovirus NOT DETECTED NOT DETECTED Final   Rhinovirus / Enterovirus DETECTED (A) NOT DETECTED Final   Influenza A NOT DETECTED NOT DETECTED Final   Influenza B NOT DETECTED NOT DETECTED Final   Parainfluenza Virus 1 NOT DETECTED NOT DETECTED Final   Parainfluenza Virus 2 NOT DETECTED NOT DETECTED Final   Parainfluenza Virus 3 NOT DETECTED NOT DETECTED Final   Parainfluenza Virus 4 NOT DETECTED NOT DETECTED Final   Respiratory Syncytial Virus NOT DETECTED NOT DETECTED Final   Bordetella pertussis NOT DETECTED NOT DETECTED Final   Chlamydophila pneumoniae NOT DETECTED NOT DETECTED Final   Mycoplasma pneumoniae NOT DETECTED NOT DETECTED Final    Comment: Performed at [facility], [address] [place], [place] [address]  MRSA PCR Screening     Status: [name]   Collection Time: 07/22/18  5:32 PM  Result Value Ref Range Status   MRSA by PCR NEGATIVE NEGATIVE Final    Comment:        The GeneXpert MRSA Assay (FDA approved for NASAL specimens only), is one component of a comprehensive MRSA colonization surveillance program. It is not intended to diagnose MRSA infection nor to guide or monitor treatment for MRSA infections. Performed at [facility], [address], [place], [place] [address]          Radiology Studies: Dg Chest Port 1 View  Result Date: 07/25/2018 CLINICAL DATA:  Respiratory failure EXAM: PORTABLE CHEST 1 VIEW COMPARISON:  July 24, 2018 FINDINGS: Elevation left hemidiaphragm remains. There is mild opacity adjacent to the left hemidiaphragm,  stable. No pneumothorax. No other interval changes. IMPRESSION: Persistent elevation left hemidiaphragm with adjacent opacity which could represent pneumonia or atelectasis. Possible tiny left effusion. Electronically Signed   By: Gerome Sam III M.D   On: 07/25/2018 08:43   Dg Chest Port 1 View  Result Date: 07/24/2018 CLINICAL DATA:  27 year old female with a history of asthma. ICU admission EXAM: PORTABLE CHEST 1 VIEW COMPARISON:  07/24/2015 FINDINGS: Cardiomediastinal silhouette unchanged in size and contour. Persistent airspace opacity at the left lung base obscuring the left hemidiaphragm. No pneumothorax.  Right lung relatively well aerated. Unchanged position of the endotracheal tube which terminates suitably above the carina, approximately 4 cm. Unchanged gastric tube. IMPRESSION: Similar appearance of the chest x-ray with left lower lobe airspace disease. Unchanged gastric tube and endotracheal tube. Electronically Signed   By: Gilmer Mor D.O.   On: 07/24/2018 09:40        Scheduled Meds: . albuterol  2.5 mg Nebulization TID  . budesonide (PULMICORT) nebulizer  solution  0.5 mg Nebulization BID  . enoxaparin (LOVENOX) injection  40 mg Subcutaneous Q24H  . Influenza vac split quadrivalent PF  0.5 mL Intramuscular Tomorrow-1000  . lidocaine (PF)  2 mL Intradermal Once  . methylPREDNISolone (SOLU-MEDROL) injection  40 mg Intravenous Q12H  . montelukast  10 mg Oral QHS  . pneumococcal 23 valent vaccine  0.5 mL Intramuscular Tomorrow-1000   Continuous Infusions: . famotidine (PEPCID) IV 20 mg (07/24/18 2056)     LOS: 3 days    Time spent: 35    Delaine Lame, MD [facility]  If 7PM-7AM, please contact night-coverage [email or web address] Password [facility] 07/25/2018, 9:48 AM

## 2018-07-26 MED ORDER — ALBUTEROL SULFATE HFA 108 (90 BASE) MCG/ACT IN AERS: 1.0000 | INHALATION_SPRAY | Freq: Four times a day (QID) | RESPIRATORY_TRACT | 11 refills | Status: AC | PRN

## 2018-07-26 MED ORDER — ALBUTEROL SULFATE (2.5 MG/3ML) 0.083% IN NEBU
2.5000 mg | INHALATION_SOLUTION | Freq: Two times a day (BID) | RESPIRATORY_TRACT | Status: DC
  Administered 2018-07-26: 2.5 mg via RESPIRATORY_TRACT
  Filled 2018-07-26: qty 3

## 2018-07-26 MED ORDER — AEROCHAMBER PLUS FLO-VU MEDIUM MISC: 1.0000 | Freq: Once | 0 refills | Status: AC

## 2018-07-26 MED ORDER — BUDESONIDE 0.5 MG/2ML IN SUSP: 0.5000 mg | Freq: Two times a day (BID) | RESPIRATORY_TRACT | 1 refills | Status: DC

## 2018-07-26 MED ORDER — ARFORMOTEROL TARTRATE 15 MCG/2ML IN NEBU: 15.0000 ug | INHALATION_SOLUTION | Freq: Two times a day (BID) | RESPIRATORY_TRACT | 1 refills | Status: DC

## 2018-07-26 MED ORDER — PREDNISONE 20 MG PO TABS: 40.0000 mg | ORAL_TABLET | Freq: Every day | ORAL | 0 refills | Status: AC

## 2018-07-26 NOTE — Discharge Summary (Signed)
Physician Discharge Summary  Laura [name] UJW:[id number] DOB: 26-Dec-1990 DOA: 07/22/2018  PCP: Patient, No Pcp Per  Admit date: 07/22/2018 Discharge date: 07/26/2018  Admitted From: Home Disposition: Home  Recommendations for Outpatient Follow-up:  1. Follow up with PCP in 1-2 weeks 2. Please obtain BMP/CBC in one week 3. Please follow with your lung doctor at [facility]: None Equipment/Devices: None  Discharge Condition: Stable CODE STATUS: Full Diet recommendation:  Regular  Brief/Interim Summary:  #) Acute hypoxic respiratory failure due to status asthmaticus: Patient was initially admitted with status asthmaticus and trialed on BiPAP however she failed this and was subsequently intubated on 07/22/2018.  She was extubated within 24 hours.  She was given scheduled bronchodilators and IV steroids.  She was discharged home on oral steroids and told to continue taking scheduled bronchodilators for several days while her symptoms continue to abate.  She was continued on home montelukast.  She was started on inhaled budesonide and arformoterol.  She will follow-up as an outpatient with her lung doctor at [facility].  Discharge Diagnoses:  Principal Problem:   Status asthmaticus Active Problems:   Acute respiratory failure with hypoxia (HCC)   Endotracheal tube present   Endotracheally intubated    Discharge Instructions  Discharge Instructions    Call MD for:  difficulty breathing, headache or visual disturbances   Complete by:  As directed    Call MD for:  extreme fatigue   Complete by:  As directed    Call MD for:  hives   Complete by:  As directed    Call MD for:  persistant dizziness or light-headedness   Complete by:  As directed    Call MD for:  persistant nausea and vomiting   Complete by:  As directed    Call MD for:  redness, tenderness, or signs of infection (pain, swelling, redness, odor or green/yellow discharge around incision site)    Complete by:  As directed    Call MD for:  severe uncontrolled pain   Complete by:  As directed    Call MD for:  temperature >100.4   Complete by:  As directed    Diet - low sodium heart healthy   Complete by:  As directed    Discharge instructions   Complete by:  As directed    Please take your steroids as prescribed.  Please follow-up with your lung doctor in 1 to 2 weeks.   Increase activity slowly   Complete by:  As directed      Allergies as of 07/26/2018   No Known Allergies     Medication List    STOP taking these medications   FLOVENT HFA 44 MCG/ACT inhaler Generic drug:  fluticasone     TAKE these medications   AEROCHAMBER PLUS FLO-VU MEDIUM Misc 1 each by Other route once for 1 dose.   albuterol 108 (90 Base) MCG/ACT inhaler Commonly known as:  PROVENTIL HFA;VENTOLIN HFA Inhale 1-2 puffs into the lungs every 6 (six) hours as needed for wheezing or shortness of breath.   arformoterol 15 MCG/2ML Nebu Commonly known as:  BROVANA Take 2 mLs (15 mcg total) by nebulization 2 (two) times daily.   budesonide 0.5 MG/2ML nebulizer solution Commonly known as:  PULMICORT Take 2 mLs (0.5 mg total) by nebulization 2 (two) times daily.   cefdinir 300 MG capsule Commonly known as:  OMNICEF Take 1 capsule (300 mg total) by mouth 2 (two) times daily.  fluticasone 50 MCG/ACT nasal spray Commonly known as:  FLONASE Place 1 spray into both nostrils daily.   guaiFENesin 100 MG/5ML Soln Commonly known as:  ROBITUSSIN Take 5 mLs (100 mg total) by mouth every 4 (four) hours as needed for cough or to loosen phlegm.   ipratropium-albuterol 0.5-2.5 (3) MG/3ML Soln Commonly known as:  DUONEB Take 3 mLs by nebulization every 6 (six) hours as needed (sob and wheezing).   montelukast 10 MG tablet Commonly known as:  SINGULAIR Take 10 mg by mouth daily.   predniSONE 20 MG tablet Commonly known as:  DELTASONE Take 2 tablets (40 mg total) by mouth daily for 5 days.       Follow-up Information    [facility] Follow up on 07/31/2018.   Specialty:  Pulmonology Why:  at 11:00 with Dr. Elisha Headland. Please bring your license.  Contact information: [address] [place] [address] [phone]       [facility] Follow up on 08/04/2018.   Specialty:  Internal Medicine Why:  at 9:20 AM for F/U Primary Care appointment. Contact information: [address] [place] [address] [phone]       [facility] Follow up.   Why:  [facility] for $4-$10 medications. Contact information: [address] [place] [address] [phone]         No Known Allergies  Consultations:  PCCM   Procedures/Studies: Dg Chest Port 1 View  Result Date: 07/25/2018 CLINICAL DATA:  Respiratory failure EXAM: PORTABLE CHEST 1 VIEW COMPARISON:  July 24, 2018 FINDINGS: Elevation left hemidiaphragm remains. There is mild opacity adjacent to the left hemidiaphragm, stable. No pneumothorax. No other interval changes. IMPRESSION: Persistent elevation left hemidiaphragm with adjacent opacity which could represent pneumonia or atelectasis. Possible tiny left effusion. Electronically Signed   By: Gerome Sam III M.D   On: 07/25/2018 08:43   Dg Chest Port 1 View  Result Date: 07/24/2018 CLINICAL DATA:  27 year old female with a history of asthma. ICU admission EXAM: PORTABLE CHEST 1 VIEW COMPARISON:  07/24/2015 FINDINGS: Cardiomediastinal silhouette unchanged in size and contour. Persistent airspace opacity at the left lung base obscuring the left hemidiaphragm. No pneumothorax.  Right lung relatively well aerated. Unchanged position of the endotracheal tube which terminates suitably above the carina, approximately 4 cm. Unchanged gastric tube. IMPRESSION: Similar appearance of the chest x-ray with left lower lobe airspace disease. Unchanged gastric tube and  endotracheal tube. Electronically Signed   By: Gilmer Mor D.O.   On: 07/24/2018 09:40   Dg Chest Port 1 View  Result Date: 07/23/2018 CLINICAL DATA:  Respiratory failure EXAM: PORTABLE CHEST 1 VIEW COMPARISON:  07/22/2018 and prior radiograph FINDINGS: An endotracheal tube with tip 3 cm above the carina and NG tube entering the stomach again noted. LEFT basilar atelectasis/consolidation has slightly improved. No other interval change since the prior study. No pneumothorax. IMPRESSION: Slightly improved LEFT basilar atelectasis/consolidation without other significant change. Electronically Signed   By: Harmon Pier M.D.   On: 07/23/2018 10:42   Dg Chest Port 1 View  Result Date: 07/22/2018 CLINICAL DATA:  27 year old female intubated for shortness of breath, respiratory failure. Self extubated, and reintubated. EXAM: PORTABLE CHEST 1 VIEW COMPARISON:  1521 hours today and earlier. FINDINGS: Portable AP semi upright view at 1903 hours. Endotracheal tube tip in good position between the level the clavicles and the carina. Enteric tube remains in place and courses  to the left abdomen, tip not included. Increased lucency in the left upper quadrant with a veiling appearance suggesting dilated gas and fluid fills stomach. Paucity of other bowel gas in the abdomen. There is consolidation at the left lung base which is stable since 1521 hours today. Ventilation elsewhere is stable. Stable cardiac size and mediastinal contours. No pneumothorax. IMPRESSION: 1. ETT tip in good position. Enteric tube courses to the left abdomen, tip not included. 2. Query moderate to severe gas and fluid distension of the stomach. Recommend attempted NG tube decompression. 3. Continued left lung base consolidation which is new since 0902 hours today. Electronically Signed   By: Odessa Fleming M.D.   On: 07/22/2018 19:26   Dg Chest Port 1 View  Result Date: 07/22/2018 CLINICAL DATA:  Endotracheal tube placement. Provided history mentions  central line placement. EXAM: PORTABLE CHEST 1 VIEW COMPARISON:  07/22/2018 at 9:02 a.m. FINDINGS: Endotracheal tube tip projects 2.1 cm above the carina, well positioned. Nasal/orogastric tube passes below the diaphragm well into the stomach. Faintly seen tube consistent with a right subclavian central line is noted. Tip is not visualized, but at least enters the upper superior vena cava. There is new opacity at the left lung base when compared to the earlier exam which may reflect a rapidly evolving pneumonia, atelectasis or a combination. Remainder of the lungs is clear. No pneumothorax. IMPRESSION: 1. Well-positioned endotracheal tube and nasal/orogastric tube. 2. Presumed right subclavian central venous line is poorly visualized. Tip is not defined on this study, but at least enters the upper superior vena cava. 3. No pneumothorax. 4. New airspace opacity has developed at the left lung base. Suspect this is a combination of atelectasis and pneumonia. Electronically Signed   By: Amie Portland M.D.   On: 07/22/2018 15:47   Dg Chest Port 1 View  Result Date: 07/22/2018 CLINICAL DATA:  Dyspnea, asthma, cough EXAM: PORTABLE CHEST 1 VIEW COMPARISON:  06/15/2018 chest radiograph. FINDINGS: Stable cardiomediastinal silhouette with normal heart size. No pneumothorax. No pleural effusion. No pulmonary edema. No acute consolidative airspace disease. No residual left lung consolidation. IMPRESSION: No active cardiopulmonary disease. Previously visualized left pleural effusion and left lung opacities have resolved. Electronically Signed   By: Delbert Phenix M.D.   On: 07/22/2018 09:46     Subjective:   Discharge Exam: Vitals:   07/26/18 0729 07/26/18 0820  BP: (!) 138/92   Pulse: (!) 58 60  Resp: 16 16  Temp: 99.3 F (37.4 C)   SpO2: 95% 96%   Vitals:   07/26/18 0117 07/26/18 0549 07/26/18 0729 07/26/18 0820  BP: 128/87  (!) 138/92   Pulse: 61 76 (!) 58 60  Resp: 16 16 16 16   Temp: 99 F (37.2 C)   99.3 F (37.4 C)   TempSrc: Oral  Oral   SpO2: 96% 96% 95% 96%  Weight:      Height:        General exam: Appears calm and comfortable  Respiratory system: no increased work of breathing, scattered expiratory wheezes on right side only, normal expiratory phase, scattered rhonchi, no crackles Cardiovascular system: Regular rate and rhythm, no murmurs Gastrointestinal system: Abdomen is nondistended, soft and nontender. No organomegaly or masses felt. Normal bowel sounds heard. Central nervous system: Alert and oriented. No focal neurological deficits. Extremities: No lower extremity edema Skin: No rashes over visible skin Psychiatry: Judgement and insight appear normal. Mood & affect appropriate.     The results of significant diagnostics from this  hospitalization (including imaging, microbiology, ancillary and laboratory) are listed below for reference.     Microbiology: Recent Results (from the past 240 hour(s))  Respiratory Panel by PCR     Status: Abnormal   Collection Time: 07/22/18 12:13 PM  Result Value Ref Range Status   Adenovirus NOT DETECTED NOT DETECTED Final   Coronavirus 229E NOT DETECTED NOT DETECTED Final   Coronavirus HKU1 NOT DETECTED NOT DETECTED Final   Coronavirus NL63 NOT DETECTED NOT DETECTED Final   Coronavirus OC43 NOT DETECTED NOT DETECTED Final   Metapneumovirus NOT DETECTED NOT DETECTED Final   Rhinovirus / Enterovirus DETECTED (A) NOT DETECTED Final   Influenza A NOT DETECTED NOT DETECTED Final   Influenza B NOT DETECTED NOT DETECTED Final   Parainfluenza Virus 1 NOT DETECTED NOT DETECTED Final   Parainfluenza Virus 2 NOT DETECTED NOT DETECTED Final   Parainfluenza Virus 3 NOT DETECTED NOT DETECTED Final   Parainfluenza Virus 4 NOT DETECTED NOT DETECTED Final   Respiratory Syncytial Virus NOT DETECTED NOT DETECTED Final   Bordetella pertussis NOT DETECTED NOT DETECTED Final   Chlamydophila pneumoniae NOT DETECTED NOT DETECTED Final   Mycoplasma  pneumoniae NOT DETECTED NOT DETECTED Final    Comment: Performed at [facility], [address], [place], [place] [address]  MRSA PCR Screening     Status: None   Collection Time: 07/22/18  5:32 PM  Result Value Ref Range Status   MRSA by PCR NEGATIVE NEGATIVE Final    Comment:        The GeneXpert MRSA Assay (FDA approved for NASAL specimens only), is one component of a comprehensive MRSA colonization surveillance program. It is not intended to diagnose MRSA infection nor to guide or monitor treatment for MRSA infections. Performed at [facility], [address], [place], [place] [address]      Labs: BNP (last 3 results) No results for input(s): BNP in the last 8760 hours. Basic Metabolic Panel: Recent Labs  Lab 07/22/18 1148 07/22/18 1544 07/23/18 0229 07/24/18 0311 07/25/18 0322  NA 138 140 139 141 142  K 2.8* 4.0 4.2 4.1 4.2  CL 105 107 107 109 110  CO2 20* 24 23 21* 22  GLUCOSE 182* 177* 142* 122* 120*  BUN 10 8 6 11 20   CREATININE 0.69 0.82 0.72 0.65 0.82  CALCIUM 8.7* 8.5* 9.0 9.1 9.0  MG  --  2.3 2.4 2.8* 2.5*  PHOS  --  5.3* 2.4*  --   --    Liver Function Tests: Recent Labs  Lab 07/22/18 1148 07/24/18 0311  AST 25 19  ALT 15 16  ALKPHOS 46 44  BILITOT 0.8 0.6  PROT 7.5 6.2*  ALBUMIN 4.2 3.4*   No results for input(s): LIPASE, AMYLASE in the last 168 hours. No results for input(s): AMMONIA in the last 168 hours. CBC: Recent Labs  Lab 07/22/18 1148 07/22/18 1544 07/23/18 0229 07/24/18 0311 07/25/18 0322  WBC 15.6* 18.9* 20.2* 14.5* 14.8*  NEUTROABS  --   --   --  13.3*  --   HGB 12.0 11.2* 11.1* 11.2* 11.2*  HCT 36.3 37.3 35.9* 36.7 35.7*  MCV 70.9* 75.8* 74.9* 74.1* 73.0*  PLT 373 377 351 421* 449*   Cardiac Enzymes: No results for input(s): CKTOTAL, CKMB, CKMBINDEX, TROPONINI in the last 168 hours. BNP: Invalid input(s): POCBNP CBG: Recent Labs  Lab 07/24/18 2106 07/25/18 0015 07/25/18 0400 07/25/18 0819  07/25/18 1159  GLUCAP 111* 115* 105* 113* 143*  D-Dimer No results for input(s): DDIMER in the last 72 hours. Hgb A1c No results for input(s): HGBA1C in the last 72 hours. Lipid Profile Recent Labs    07/25/18 1454  TRIG 105   Thyroid function studies No results for input(s): TSH, T4TOTAL, T3FREE, THYROIDAB in the last 72 hours.  Invalid input(s): FREET3 Anemia work up No results for input(s): VITAMINB12, FOLATE, FERRITIN, TIBC, IRON, RETICCTPCT in the last 72 hours. Urinalysis    Component Value Date/Time   COLORURINE YELLOW 10/08/2017 1018   APPEARANCEUR CLEAR 10/08/2017 1018   LABSPEC 1.015 10/08/2017 1018   PHURINE 7.0 10/08/2017 1018   GLUCOSEU NEGATIVE 10/08/2017 1018   HGBUR TRACE (A) 10/08/2017 1018   BILIRUBINUR SMALL (A) 10/08/2017 1018   KETONESUR >80 (A) 10/08/2017 1018   PROTEINUR 30 (A) 10/08/2017 1018   UROBILINOGEN 2.0 (H) 11/18/2014 1713   NITRITE NEGATIVE 10/08/2017 1018   LEUKOCYTESUR NEGATIVE 10/08/2017 1018   Sepsis Labs Invalid input(s): PROCALCITONIN,  WBC,  LACTICIDVEN Microbiology Recent Results (from the past 240 hour(s))  Respiratory Panel by PCR     Status: Abnormal   Collection Time: 07/22/18 12:13 PM  Result Value Ref Range Status   Adenovirus NOT DETECTED NOT DETECTED Final   Coronavirus 229E NOT DETECTED NOT DETECTED Final   Coronavirus HKU1 NOT DETECTED NOT DETECTED Final   Coronavirus NL63 NOT DETECTED NOT DETECTED Final   Coronavirus OC43 NOT DETECTED NOT DETECTED Final   Metapneumovirus NOT DETECTED NOT DETECTED Final   Rhinovirus / Enterovirus DETECTED (A) NOT DETECTED Final   Influenza A NOT DETECTED NOT DETECTED Final   Influenza B NOT DETECTED NOT DETECTED Final   Parainfluenza Virus 1 NOT DETECTED NOT DETECTED Final   Parainfluenza Virus 2 NOT DETECTED NOT DETECTED Final   Parainfluenza Virus 3 NOT DETECTED NOT DETECTED Final   Parainfluenza Virus 4 NOT DETECTED NOT DETECTED Final   Respiratory Syncytial Virus NOT  DETECTED NOT DETECTED Final   Bordetella pertussis NOT DETECTED NOT DETECTED Final   Chlamydophila pneumoniae NOT DETECTED NOT DETECTED Final   Mycoplasma pneumoniae NOT DETECTED NOT DETECTED Final    Comment: Performed at [facility], [address], [place], [place] [address]  MRSA PCR Screening     Status: None   Collection Time: 07/22/18  5:32 PM  Result Value Ref Range Status   MRSA by PCR NEGATIVE NEGATIVE Final    Comment:        The GeneXpert MRSA Assay (FDA approved for NASAL specimens only), is one component of a comprehensive MRSA colonization surveillance program. It is not intended to diagnose MRSA infection nor to guide or monitor treatment for MRSA infections. Performed at [facility], [address], [place], [place] [address]      Time coordinating discharge: 21  SIGNED:   Delaine Lame, MD  [facility] 07/26/2018, 9:36 AM  If 7PM-7AM, please contact night-coverage [email or web address] Password [email or web address]

## 2018-07-26 NOTE — Discharge Instructions (Signed)

## 2018-07-30 NOTE — Progress Notes (Deleted)
@Patient  ID: Laura SciaraAshley M [name], female    DOB: 04-05-1991, 27 y.o.   MRN: [id number]  No chief complaint on file.   Referring provider: No ref. provider found  HPI:  27 year old female never smoker initially consulted with our practice on 07/23/2018 for asthma exacerbation requiring intubation/status of asthmaticus.  Extubated 07/24/2018.  Multiple barriers to care including no insurance.  Patient cannot afford controller inhalers.  Patient was discharged from the hospital 07/26/2018 with budesonide nebulized solution to take twice daily.  PMH: Asthma Smoker/ Smoking History: Never smoker, passive smoke exposure Maintenance: Pulmicort and Brovana Pt of:   Recent  Pulmonary Encounters:   07/23/18-H&P/consult note- [facility] >>>Status asthmaticus requiring intubation.   >>>Extubated 07/24/2018   07/30/2018  - Visit   HPI  Spirometry, FeNO  Tests:    07/22/2018-respiratory virus panel- rhinovirus detected 07/25/2018-chest x-ray-persistent elevation left hemidiaphragm with adjacent opacity which could represent pneumonia or atelectasis, possible tiny left effusion  Chart Review:  07/26/2018-discharge summary- follow-up with PCP in 1 to 2 weeks, obtain BMP and CBC in 1 week, follow-up with pulmonologist >>>Discharged with budesonide and Brovana nebulized solution to take twice daily    Specialty Problems      Pulmonary Problems   Asthma exacerbation   Community acquired pneumonia   Acute respiratory failure with hypoxia (HCC)   Endotracheal tube present   Endotracheally intubated   Status asthmaticus      No Known Allergies  Immunization History  Administered Date(s) Administered  . Influenza,inj,Quad PF,6+ Mos 07/26/2018  . Pneumococcal Polysaccharide-23 07/26/2018   Flu and pneumonia vaccines are up-to-date  Past Medical History:  Diagnosis Date  . Asthma     Tobacco History: Social History   Tobacco Use  Smoking Status Passive Smoke Exposure - Never Smoker    Smokeless Tobacco Never Used   Counseling given: Not Answered   Outpatient Encounter Medications as of 07/31/2018  Medication Sig  . albuterol (PROVENTIL HFA;VENTOLIN HFA) 108 (90 Base) MCG/ACT inhaler Inhale 1-2 puffs into the lungs every 6 (six) hours as needed for wheezing or shortness of breath.  Marland Kitchen. arformoterol (BROVANA) 15 MCG/2ML NEBU Take 2 mLs (15 mcg total) by nebulization 2 (two) times daily.  . budesonide (PULMICORT) 0.5 MG/2ML nebulizer solution Take 2 mLs (0.5 mg total) by nebulization 2 (two) times daily.  . cefdinir (OMNICEF) 300 MG capsule Take 1 capsule (300 mg total) by mouth 2 (two) times daily.  . fluticasone (FLONASE) 50 MCG/ACT nasal spray Place 1 spray into both nostrils daily.  Marland Kitchen. guaiFENesin (ROBITUSSIN) 100 MG/5ML SOLN Take 5 mLs (100 mg total) by mouth every 4 (four) hours as needed for cough or to loosen phlegm.  Marland Kitchen. ipratropium-albuterol (DUONEB) 0.5-2.5 (3) MG/3ML SOLN Take 3 mLs by nebulization every 6 (six) hours as needed (sob and wheezing).  . montelukast (SINGULAIR) 10 MG tablet Take 10 mg by mouth daily.  . predniSONE (DELTASONE) 20 MG tablet Take 2 tablets (40 mg total) by mouth daily for 5 days.   No facility-administered encounter medications on file as of 07/31/2018.      Review of Systems  Review of Systems   Physical Exam  LMP 07/10/2018   Wt Readings from Last 5 Encounters:  07/26/18 163 lb 9.3 oz (74.2 kg)  06/15/18 157 lb (71.2 kg)  12/21/17 160 lb (72.6 kg)  10/08/17 178 lb (80.7 kg)  08/18/17 180 lb 5.4 oz (81.8 kg)     Physical Exam    Lab Results:  CBC    Component  Value Date/Time   WBC 14.8 (H) 07/25/2018 0322   RBC 4.89 07/25/2018 0322   HGB 11.2 (L) 07/25/2018 0322   HCT 35.7 (L) 07/25/2018 0322   PLT 449 (H) 07/25/2018 0322   MCV 73.0 (L) 07/25/2018 0322   MCH 22.9 (L) 07/25/2018 0322   MCHC 31.4 07/25/2018 0322   RDW 15.2 07/25/2018 0322   LYMPHSABS 0.7 07/24/2018 0311   MONOABS 0.3 07/24/2018 0311   EOSABS  0.0 07/24/2018 0311   BASOSABS 0.0 07/24/2018 0311    BMET    Component Value Date/Time   NA 142 07/25/2018 0322   K 4.2 07/25/2018 0322   CL 110 07/25/2018 0322   CO2 22 07/25/2018 0322   GLUCOSE 120 (H) 07/25/2018 0322   BUN 20 07/25/2018 0322   CREATININE 0.82 07/25/2018 0322   CALCIUM 9.0 07/25/2018 0322   GFRNONAA >60 07/25/2018 0322   GFRAA >60 07/25/2018 0322    BNP No results found for: BNP  ProBNP No results found for: PROBNP  Imaging: Dg Chest Port 1 View  Result Date: 07/25/2018 CLINICAL DATA:  Respiratory failure EXAM: PORTABLE CHEST 1 VIEW COMPARISON:  July 24, 2018 FINDINGS: Elevation left hemidiaphragm remains. There is mild opacity adjacent to the left hemidiaphragm, stable. No pneumothorax. No other interval changes. IMPRESSION: Persistent elevation left hemidiaphragm with adjacent opacity which could represent pneumonia or atelectasis. Possible tiny left effusion. Electronically Signed   By: Gerome Sam III M.D   On: 07/25/2018 08:43   Dg Chest Port 1 View  Result Date: 07/24/2018 CLINICAL DATA:  27 year old female with a history of asthma. ICU admission EXAM: PORTABLE CHEST 1 VIEW COMPARISON:  07/24/2015 FINDINGS: Cardiomediastinal silhouette unchanged in size and contour. Persistent airspace opacity at the left lung base obscuring the left hemidiaphragm. No pneumothorax.  Right lung relatively well aerated. Unchanged position of the endotracheal tube which terminates suitably above the carina, approximately 4 cm. Unchanged gastric tube. IMPRESSION: Similar appearance of the chest x-ray with left lower lobe airspace disease. Unchanged gastric tube and endotracheal tube. Electronically Signed   By: Gilmer Mor D.O.   On: 07/24/2018 09:40   Dg Chest Port 1 View  Result Date: 07/23/2018 CLINICAL DATA:  Respiratory failure EXAM: PORTABLE CHEST 1 VIEW COMPARISON:  07/22/2018 and prior radiograph FINDINGS: An endotracheal tube with tip 3 cm above the carina and  NG tube entering the stomach again noted. LEFT basilar atelectasis/consolidation has slightly improved. No other interval change since the prior study. No pneumothorax. IMPRESSION: Slightly improved LEFT basilar atelectasis/consolidation without other significant change. Electronically Signed   By: Harmon Pier M.D.   On: 07/23/2018 10:42   Dg Chest Port 1 View  Result Date: 07/22/2018 CLINICAL DATA:  27 year old female intubated for shortness of breath, respiratory failure. Self extubated, and reintubated. EXAM: PORTABLE CHEST 1 VIEW COMPARISON:  1521 hours today and earlier. FINDINGS: Portable AP semi upright view at 1903 hours. Endotracheal tube tip in good position between the level the clavicles and the carina. Enteric tube remains in place and courses to the left abdomen, tip not included. Increased lucency in the left upper quadrant with a veiling appearance suggesting dilated gas and fluid fills stomach. Paucity of other bowel gas in the abdomen. There is consolidation at the left lung base which is stable since 1521 hours today. Ventilation elsewhere is stable. Stable cardiac size and mediastinal contours. No pneumothorax. IMPRESSION: 1. ETT tip in good position. Enteric tube courses to the left abdomen, tip not included. 2. Query moderate  to severe gas and fluid distension of the stomach. Recommend attempted NG tube decompression. 3. Continued left lung base consolidation which is new since 0902 hours today. Electronically Signed   By: Odessa Fleming M.D.   On: 07/22/2018 19:26   Dg Chest Port 1 View  Result Date: 07/22/2018 CLINICAL DATA:  Endotracheal tube placement. Provided history mentions central line placement. EXAM: PORTABLE CHEST 1 VIEW COMPARISON:  07/22/2018 at 9:02 a.m. FINDINGS: Endotracheal tube tip projects 2.1 cm above the carina, well positioned. Nasal/orogastric tube passes below the diaphragm well into the stomach. Faintly seen tube consistent with a right subclavian central line is noted.  Tip is not visualized, but at least enters the upper superior vena cava. There is new opacity at the left lung base when compared to the earlier exam which may reflect a rapidly evolving pneumonia, atelectasis or a combination. Remainder of the lungs is clear. No pneumothorax. IMPRESSION: 1. Well-positioned endotracheal tube and nasal/orogastric tube. 2. Presumed right subclavian central venous line is poorly visualized. Tip is not defined on this study, but at least enters the upper superior vena cava. 3. No pneumothorax. 4. New airspace opacity has developed at the left lung base. Suspect this is a combination of atelectasis and pneumonia. Electronically Signed   By: Amie Portland M.D.   On: 07/22/2018 15:47   Dg Chest Port 1 View  Result Date: 07/22/2018 CLINICAL DATA:  Dyspnea, asthma, cough EXAM: PORTABLE CHEST 1 VIEW COMPARISON:  06/15/2018 chest radiograph. FINDINGS: Stable cardiomediastinal silhouette with normal heart size. No pneumothorax. No pleural effusion. No pulmonary edema. No acute consolidative airspace disease. No residual left lung consolidation. IMPRESSION: No active cardiopulmonary disease. Previously visualized left pleural effusion and left lung opacities have resolved. Electronically Signed   By: Delbert Phenix M.D.   On: 07/22/2018 09:46      Assessment & Plan:     No problem-specific Assessment & Plan notes found for this encounter.     Coral Ceo, NP 07/30/2018

## 2018-07-31 ENCOUNTER — Inpatient Hospital Stay: Payer: Medicaid Other | Admitting: Pulmonary Disease

## 2018-08-04 ENCOUNTER — Ambulatory Visit: Payer: Medicaid Other | Admitting: Family Medicine

## 2019-12-01 IMAGING — DX DG CHEST 1V PORT
1 series · 1 of 1 positions shown · non-contrast
Comparison: 07/22/2018 and prior radiograph

CLINICAL DATA: Respiratory failure

EXAM:
PORTABLE CHEST 1 VIEW

[chest]
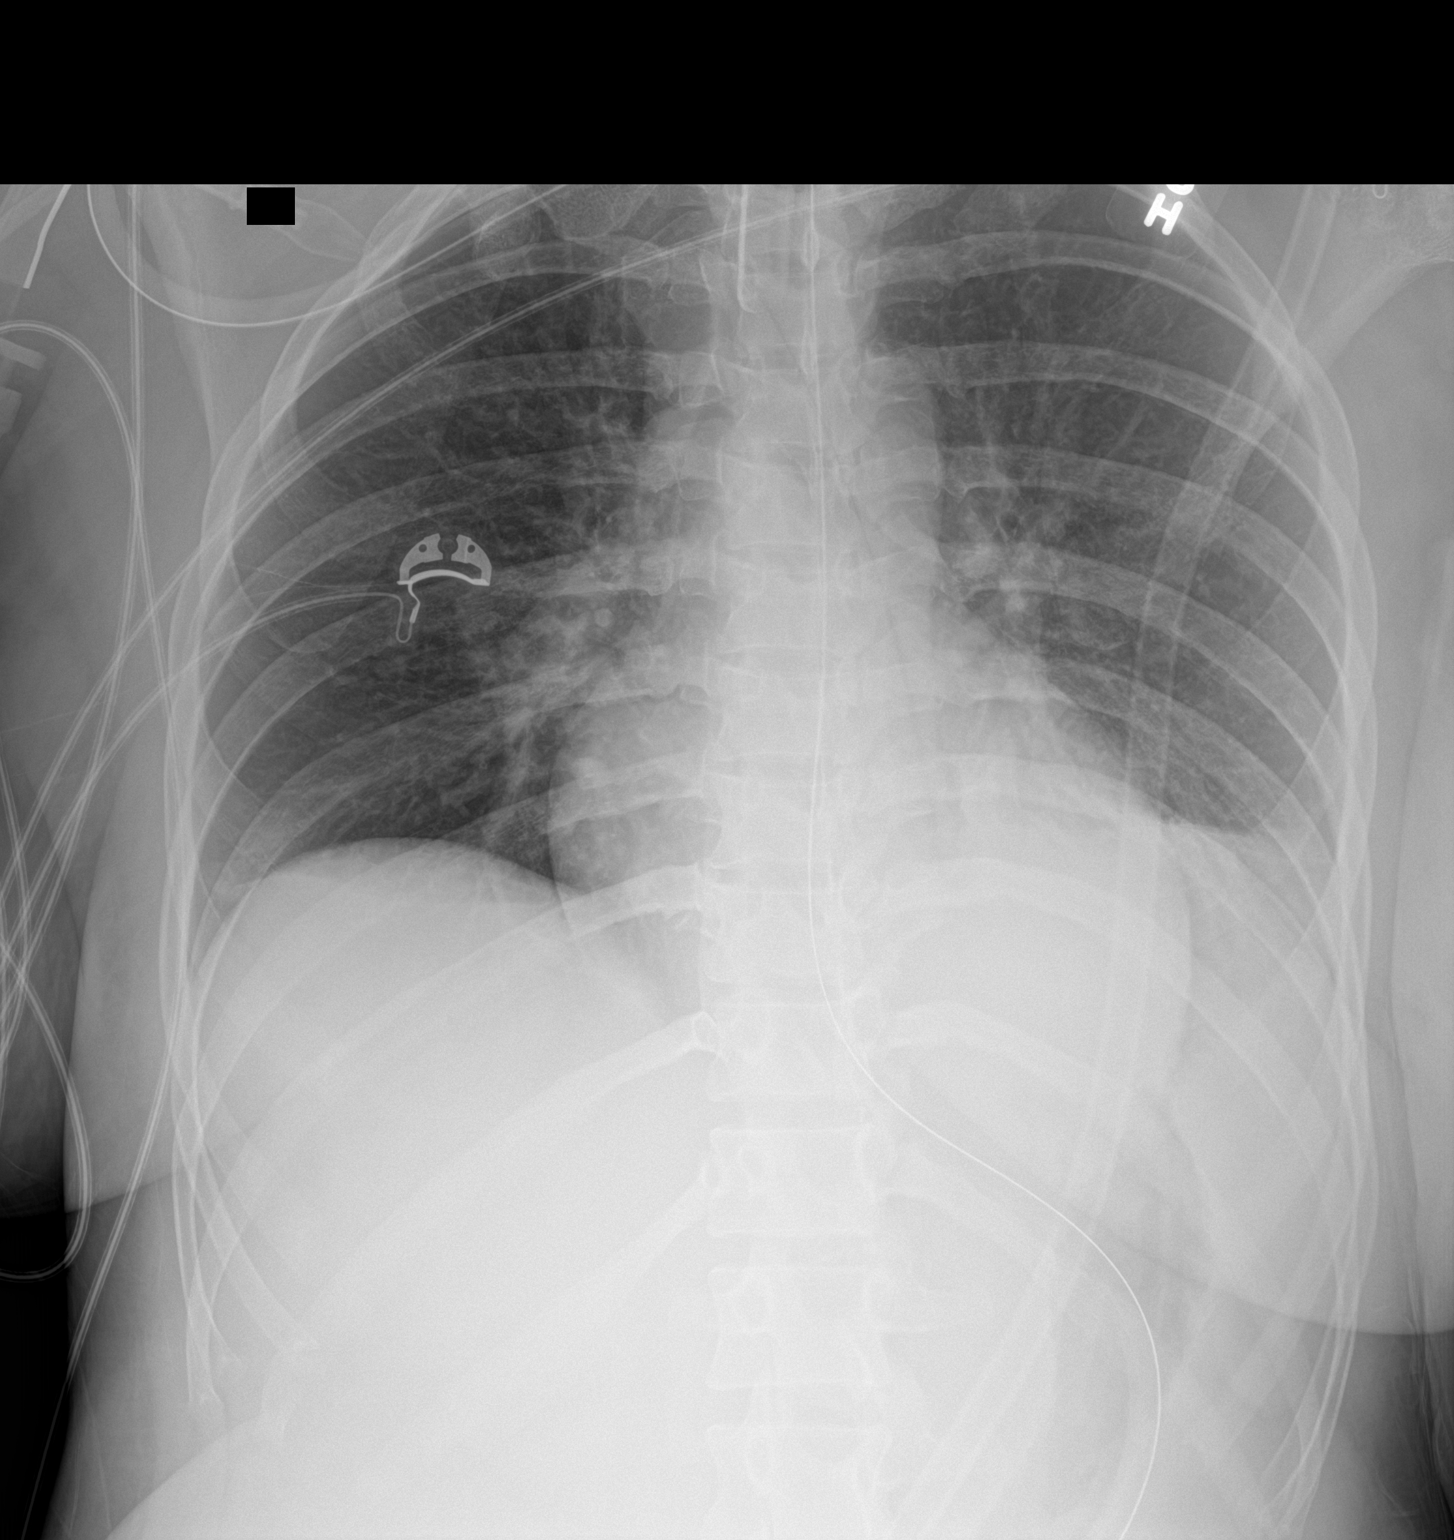

[1 of 1 positions shown; findings below may reference images not displayed]

FINDINGS: An endotracheal tube with tip 3 cm above the carina and NG tube
entering the stomach again noted.

LEFT basilar atelectasis/consolidation has slightly improved.

No other interval change since the prior study.

No pneumothorax.
IMPRESSION: Slightly improved LEFT basilar atelectasis/consolidation without
other significant change.

## 2019-12-03 IMAGING — DX DG CHEST 1V PORT
1 series · 1 of 1 positions shown · non-contrast
Comparison: July 24, 2018

CLINICAL DATA: Respiratory failure

EXAM:
PORTABLE CHEST 1 VIEW

[chest]
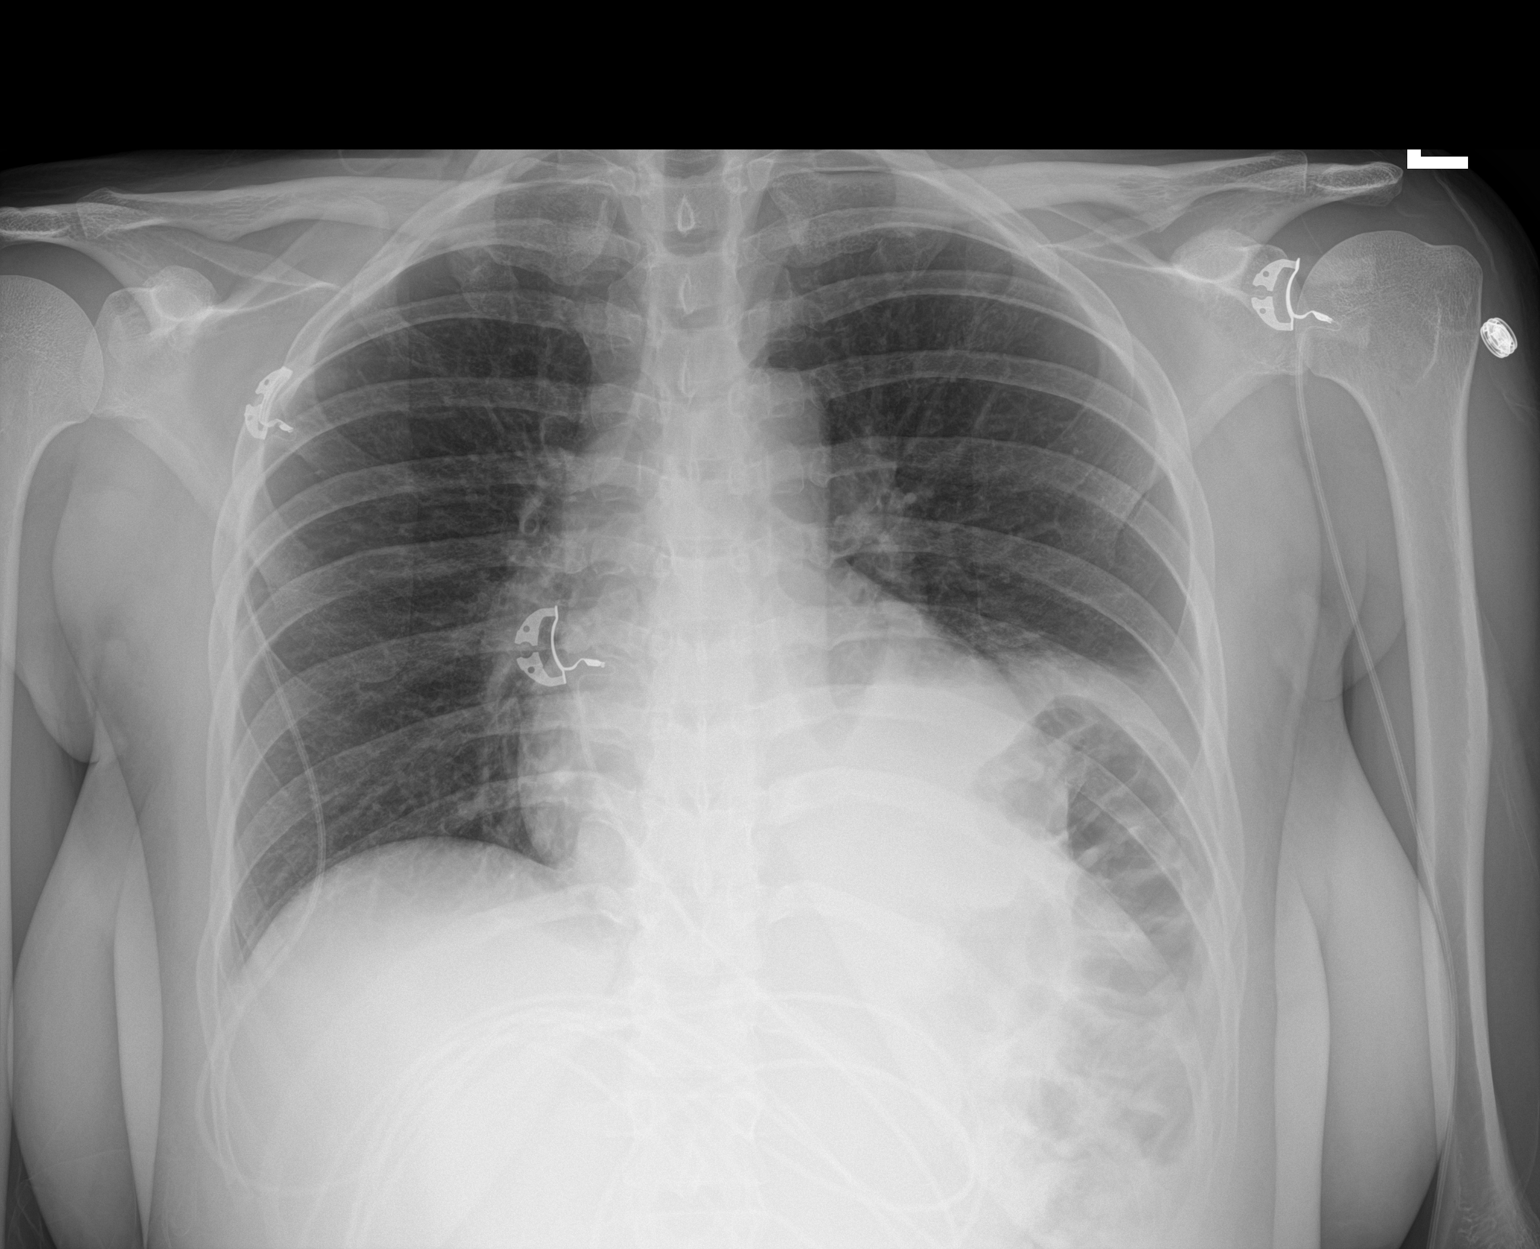

[1 of 1 positions shown; findings below may reference images not displayed]

FINDINGS: Elevation left hemidiaphragm remains. There is mild opacity adjacent
to the left hemidiaphragm, stable. No pneumothorax. No other
interval changes.
IMPRESSION: Persistent elevation left hemidiaphragm with adjacent opacity which
could represent pneumonia or atelectasis. Possible tiny left
effusion.

## 2020-08-29 ENCOUNTER — Telehealth: Payer: Self-pay | Admitting: Physician Assistant

## 2020-08-29 ENCOUNTER — Encounter: Payer: Self-pay | Admitting: Physician Assistant

## 2020-08-29 ENCOUNTER — Other Ambulatory Visit (HOSPITAL_COMMUNITY): Payer: Self-pay | Admitting: Physician Assistant

## 2020-08-29 NOTE — Telephone Encounter (Signed)
Called to Discuss with patient about Covid symptoms and the use of the monoclonal antibody infusion for those with mild to moderate Covid symptoms and at a high risk of hospitalization.     Pt appears to qualify for this infusion due to co-morbid conditions and/or a member of an at-risk group in accordance with the FDA Emergency Use Authorization.    Unable to reach pt - voicemail box is full. I was able to send a MyChart message.  Pt qualifies with SVI and asthma.

## 2020-08-29 NOTE — Progress Notes (Signed)
I connected by phone with Laura [name] on 08/29/2020 at 5:30 PM to discuss the potential use of a new treatment for mild to moderate COVID-19 viral infection in non-hospitalized patients.  This patient is a 29 y.o. female that meets the FDA criteria for Emergency Use Authorization of COVID monoclonal antibody casirivimab/imdevimab or bamlanivimab/eteseviamb.  Has a (+) direct SARS-CoV-2 viral test result  Has mild or moderate COVID-19   Is NOT hospitalized due to COVID-19  Is within 10 days of symptom onset  Has at least one of the high risk factor(s) for progression to severe COVID-19 and/or hospitalization as defined in EUA.  Specific high risk criteria : Chronic Lung Disease   I have spoken and communicated the following to the patient or parent/caregiver regarding COVID monoclonal antibody treatment:  1. FDA has authorized the emergency use for the treatment of mild to moderate COVID-19 in adults and pediatric patients with positive results of direct SARS-CoV-2 viral testing who are 58 years of age and older weighing at least 40 kg, and who are at high risk for progressing to severe COVID-19 and/or hospitalization.  2. The significant known and potential risks and benefits of COVID monoclonal antibody, and the extent to which such potential risks and benefits are unknown.  3. Information on available alternative treatments and the risks and benefits of those alternatives, including clinical trials.  4. Patients treated with COVID monoclonal antibody should continue to self-isolate and use infection control measures (e.g., wear mask, isolate, social distance, avoid sharing personal items, clean and disinfect "high touch" surfaces, and frequent handwashing) according to CDC guidelines.   5. The patient or parent/caregiver has the option to accept or refuse COVID monoclonal antibody treatment.  After reviewing this information with the patient, the patient has agreed to receive one  of the available covid 19 monoclonal antibodies and will be provided an appropriate fact sheet prior to infusion. Jodell Cipro, PA-C 08/29/2020 5:30 PM

## 2020-08-30 ENCOUNTER — Ambulatory Visit (HOSPITAL_COMMUNITY)
Admission: RE | Admit: 2020-08-30 | Discharge: 2020-08-30 | Disposition: A | Discharge status: Home / Self Care | Payer: BC Managed Care – PPO | Source: Ambulatory Visit | Attending: Pulmonary Disease | Admitting: Pulmonary Disease

## 2020-08-30 DIAGNOSIS — Z23 Encounter for immunization: Secondary | ICD-10-CM | POA: Diagnosis not present

## 2020-08-30 DIAGNOSIS — U071 COVID-19: Secondary | ICD-10-CM | POA: Insufficient documentation

## 2020-08-30 MED ORDER — SODIUM CHLORIDE 0.9 % IV SOLN: INTRAVENOUS | Status: DC | PRN

## 2020-08-30 MED ORDER — DIPHENHYDRAMINE HCL 50 MG/ML IJ SOLN: 50.0000 mg | Freq: Once | INTRAMUSCULAR | Status: DC | PRN

## 2020-08-30 MED ORDER — FAMOTIDINE IN NACL 20-0.9 MG/50ML-% IV SOLN: 20.0000 mg | Freq: Once | INTRAVENOUS | Status: DC | PRN

## 2020-08-30 MED ORDER — EPINEPHRINE 0.3 MG/0.3ML IJ SOAJ: 0.3000 mg | Freq: Once | INTRAMUSCULAR | Status: DC | PRN

## 2020-08-30 MED ORDER — SODIUM CHLORIDE 0.9 % IV SOLN: Freq: Once | INTRAVENOUS | Status: AC

## 2020-08-30 MED ORDER — METHYLPREDNISOLONE SODIUM SUCC 125 MG IJ SOLR: 125.0000 mg | Freq: Once | INTRAMUSCULAR | Status: DC | PRN

## 2020-08-30 MED ORDER — ALBUTEROL SULFATE HFA 108 (90 BASE) MCG/ACT IN AERS: 2.0000 | INHALATION_SPRAY | Freq: Once | RESPIRATORY_TRACT | Status: DC | PRN

## 2020-08-30 NOTE — Discharge Instructions (Signed)

## 2020-08-30 NOTE — Progress Notes (Signed)
  Diagnosis: COVID-19  Physician: Dr. Patrick Wright  Procedure: Covid Infusion Clinic Med: bamlanivimab\etesevimab infusion - Provided patient with bamlanimivab\etesevimab fact sheet for patients, parents and caregivers prior to infusion.  Complications: No immediate complications noted.  Discharge: Discharged home   Laura [name] 08/30/2020   

## 2020-11-21 ENCOUNTER — Emergency Department (HOSPITAL_BASED_OUTPATIENT_CLINIC_OR_DEPARTMENT_OTHER)
Admission: EM | Admit: 2020-11-21 | Discharge: 2020-11-21 | Disposition: A | Discharge status: Home / Self Care | Payer: BC Managed Care – PPO | Attending: Emergency Medicine | Admitting: Emergency Medicine

## 2020-11-21 ENCOUNTER — Emergency Department (HOSPITAL_BASED_OUTPATIENT_CLINIC_OR_DEPARTMENT_OTHER): Payer: BC Managed Care – PPO

## 2020-11-21 ENCOUNTER — Other Ambulatory Visit (HOSPITAL_BASED_OUTPATIENT_CLINIC_OR_DEPARTMENT_OTHER): Payer: Self-pay | Admitting: Medical

## 2020-11-21 ENCOUNTER — Encounter (HOSPITAL_BASED_OUTPATIENT_CLINIC_OR_DEPARTMENT_OTHER): Payer: Self-pay | Admitting: Emergency Medicine

## 2020-11-21 ENCOUNTER — Other Ambulatory Visit: Payer: Self-pay

## 2020-11-21 DIAGNOSIS — Z7722 Contact with and (suspected) exposure to environmental tobacco smoke (acute) (chronic): Secondary | ICD-10-CM | POA: Diagnosis not present

## 2020-11-21 DIAGNOSIS — R109 Unspecified abdominal pain: Secondary | ICD-10-CM | POA: Diagnosis present

## 2020-11-21 DIAGNOSIS — J45909 Unspecified asthma, uncomplicated: Secondary | ICD-10-CM | POA: Insufficient documentation

## 2020-11-21 DIAGNOSIS — Z7951 Long term (current) use of inhaled steroids: Secondary | ICD-10-CM | POA: Diagnosis not present

## 2020-11-21 DIAGNOSIS — N200 Calculus of kidney: Secondary | ICD-10-CM | POA: Insufficient documentation

## 2020-11-21 LAB — URINALYSIS, ROUTINE W REFLEX MICROSCOPIC
Bilirubin Urine: NEGATIVE
Bilirubin Urine: NEGATIVE
Glucose, UA: NEGATIVE mg/dL
Glucose, UA: NEGATIVE mg/dL
Hgb urine dipstick: NEGATIVE
Ketones, ur: NEGATIVE mg/dL
Ketones, ur: NEGATIVE mg/dL
Leukocytes,Ua: NEGATIVE
Nitrite: NEGATIVE
Nitrite: NEGATIVE
Protein, ur: NEGATIVE mg/dL
Protein, ur: NEGATIVE mg/dL
Specific Gravity, Urine: 1.01 (ref 1.005–1.030)
Specific Gravity, Urine: 1.01 (ref 1.005–1.030)
pH: 8.5 — ABNORMAL HIGH (ref 5.0–8.0)
pH: 8.5 — ABNORMAL HIGH (ref 5.0–8.0)

## 2020-11-21 LAB — COMPREHENSIVE METABOLIC PANEL
ALT: 13 U/L (ref 0–44)
AST: 17 U/L (ref 15–41)
Albumin: 4.3 g/dL (ref 3.5–5.0)
Alkaline Phosphatase: 35 U/L — ABNORMAL LOW (ref 38–126)
Anion gap: 9 (ref 5–15)
BUN: 10 mg/dL (ref 6–20)
CO2: 21 mmol/L — ABNORMAL LOW (ref 22–32)
Calcium: 9.3 mg/dL (ref 8.9–10.3)
Chloride: 108 mmol/L (ref 98–111)
Creatinine, Ser: 0.94 mg/dL (ref 0.44–1.00)
GFR, Estimated: >60 mL/min (ref >60)
Glucose, Bld: 102 mg/dL — ABNORMAL HIGH (ref 70–99)
Potassium: 4.1 mmol/L (ref 3.5–5.1)
Sodium: 138 mmol/L (ref 135–145)
Total Bilirubin: 0.6 mg/dL (ref 0.3–1.2)
Total Protein: 6.9 g/dL (ref 6.5–8.1)

## 2020-11-21 LAB — CBC
HCT: 39.7 % (ref 36.0–46.0)
Hemoglobin: 12.5 g/dL (ref 12.0–15.0)
MCH: 23.4 pg — ABNORMAL LOW (ref 26.0–34.0)
MCHC: 31.5 g/dL (ref 30.0–36.0)
MCV: 74.2 fL — ABNORMAL LOW (ref 80.0–100.0)
Platelets: 357 10*3/uL (ref 150–400)
RBC: 5.35 MIL/uL — ABNORMAL HIGH (ref 3.87–5.11)
RDW: 15.8 % — ABNORMAL HIGH (ref 11.5–15.5)
WBC: 8.5 10*3/uL (ref 4.0–10.5)
nRBC: 0 % (ref 0.0–0.2)

## 2020-11-21 LAB — URINALYSIS, MICROSCOPIC (REFLEX)

## 2020-11-21 LAB — PREGNANCY, URINE: Preg Test, Ur: NEGATIVE

## 2020-11-21 LAB — LIPASE, BLOOD: Lipase: 31 U/L (ref 11–51)

## 2020-11-21 MED ORDER — TAMSULOSIN HCL 0.4 MG PO CAPS: 0.4000 mg | ORAL_CAPSULE | Freq: Every day | ORAL | 0 refills | Status: DC

## 2020-11-21 MED ORDER — ONDANSETRON 4 MG PO TBDP
4.0000 mg | ORAL_TABLET | Freq: Once | ORAL | Status: AC | PRN
  Administered 2020-11-21: 4 mg via ORAL
  Filled 2020-11-21: qty 1

## 2020-11-21 MED ORDER — ONDANSETRON 4 MG PO TBDP: 4.0000 mg | ORAL_TABLET | Freq: Three times a day (TID) | ORAL | 0 refills | Status: DC | PRN

## 2020-11-21 MED ORDER — HYDROCODONE-ACETAMINOPHEN 5-325 MG PO TABS: 1.0000 | ORAL_TABLET | Freq: Four times a day (QID) | ORAL | 0 refills | Status: DC | PRN

## 2020-11-21 MED FILL — TAMSULOSIN HCL 0.4 MG CAP: 0.4 | 7 days supply | Qty: 7 | Fill #0

## 2020-11-21 MED FILL — HYDROCODON-APAP 5-325: 5-325 | 1 days supply | Qty: 3 | Fill #0

## 2020-11-21 MED FILL — ONDANSETRON ODT 4 MG TABLET: 4 | 2 days supply | Qty: 5 | Fill #0

## 2020-11-21 NOTE — ED Notes (Signed)
Patient given water for PO challenge and urine cup with instructions for repeating the clean catch with the emphasis on cleaning prior to collection

## 2020-11-21 NOTE — Discharge Instructions (Addendum)
At this time there does not appear to be the presence of an emergent medical condition, however there is always the potential for conditions to change. Please read and follow the below instructions.  Please return to the Emergency Department immediately for any new or worsening symptoms or if your symptoms do not improve within 2 days. Please be sure to follow up with your Primary Care Provider within one week regarding your visit today; please call their office to schedule an appointment even if you are feeling better for a follow-up visit. Please call the urologist Dr. Berneice Heinrich on your discharge paperwork for follow-up ointment regarding your kidney stone. You may use the medication Flomax as prescribed to help facilitate passage of your kidney stone. You may take the medication Norco (Hydrocodone/Acetaminophen) as prescribed to help with severe pain.  This medication will make you drowsy so do not drive, drink alcohol, take other sedating medications or perform any dangerous activities such as driving after taking Norco. Norco contains Tylenol (acetaminophen) so do not take any other Tylenol-containing products with Norco. You also had a small right-sided kidney stone as well which is still in the kidney so not causing any pain at this time, follow-up with the urologist as recommended for further evaluation You may use the nausea medication Zofran as prescribed.  Zofran will dissolve under your tongue so do not chew or swallow it.  Go to the nearest Emergency Department immediately if: You have fever or chills You get very bad pain. You get new pain in your belly (abdomen). You pass out (faint). You cannot pee. You have any new/concerning or worsening of symptoms   Please read the additional information packets attached to your discharge summary.  Do not take your medicine if  develop an itchy rash, swelling in your mouth or lips, or difficulty breathing; call 911 and seek immediate emergency  medical attention if this occurs.  You may review your lab tests and imaging results in their entirety on your MyChart account.  Please discuss all results of fully with your primary care provider and other specialist at your follow-up visit.  Note: Portions of this text may have been transcribed using voice recognition software. Every effort was made to ensure accuracy; however, inadvertent computerized transcription errors may still be present.

## 2020-11-21 NOTE — ED Provider Notes (Signed)
[facility] EMERGENCY DEPARTMENT Provider Note   CSN: [id number] Arrival date & time: 11/21/20  0093     History Chief Complaint  Patient presents with  . Emesis  . Abdominal Pain    Laura [name] is a 30 y.o. female history includes asthma.  Patient arrives today for abdominal pain nausea vomiting that began around 2 hours ago.  She reports this morning she ate [employer] for breakfast, had been feeling okay until around 1 hour afterwards she developed left sided abdominal cramping and left flank pain she reports his pain was mild constant nonradiating and associated nausea and nonbloody/nonbilious emesis.  She reports that after she came to the ER she received nausea medication and her symptoms have completely resolved.  She reports she is now feeling well.  Denies fever/chills, headache, cough/shortness of breath, chest pain, dysuria/hematuria, vaginal bleeding/discharge, concern for STI or any additional concerns.  HPI     Past Medical History:  Diagnosis Date  . Asthma     Patient Active Problem List   Diagnosis Date Noted  . Status asthmaticus 07/22/2018  . Acute respiratory failure with hypoxia (HCC)   . Endotracheal tube present   . Endotracheally intubated   . Community acquired pneumonia 06/16/2018  . Nausea & vomiting 06/16/2018  . Lactic acidosis 06/16/2018  . Tachycardia 06/16/2018  . Hypokalemia 08/19/2017  . Asthma exacerbation 08/18/2017    History reviewed. No pertinent surgical history.   OB History    Gravida  1   Para      Term      Preterm      AB      Living        SAB      IAB      Ectopic      Multiple      Live Births              Family History  Problem Relation Age of Onset  . Asthma Father   . Asthma Maternal Grandmother     Social History   Tobacco Use  . Smoking status: Passive Smoke Exposure - Never Smoker  . Smokeless tobacco: Never Used  Vaping Use  . Vaping Use: Never used  Substance  Use Topics  . Alcohol use: Not Currently  . Drug use: No    Home Medications Prior to Admission medications   Medication Sig Start Date End Date Taking? Authorizing Provider  HYDROcodone-acetaminophen (NORCO/VICODIN) 5-325 MG tablet Take 1 tablet by mouth every 6 (six) hours as needed. 11/21/20  Yes Harlene Salts A, PA-C  ondansetron (ZOFRAN ODT) 4 MG disintegrating tablet Take 1 tablet (4 mg total) by mouth every 8 (eight) hours as needed for nausea or vomiting. 11/21/20  Yes Harlene Salts A, PA-C  tamsulosin (FLOMAX) 0.4 MG CAPS capsule Take 1 capsule (0.4 mg total) by mouth daily. 11/21/20  Yes Harlene Salts A, PA-C  albuterol (PROVENTIL HFA;VENTOLIN HFA) 108 (90 Base) MCG/ACT inhaler Inhale 1-2 puffs into the lungs every 6 (six) hours as needed for wheezing or shortness of breath. 07/26/18   Purohit, Salli Quarry, MD  arformoterol (BROVANA) 15 MCG/2ML NEBU Take 2 mLs (15 mcg total) by nebulization 2 (two) times daily. 07/26/18   Purohit, Salli Quarry, MD  budesonide (PULMICORT) 0.5 MG/2ML nebulizer solution Take 2 mLs (0.5 mg total) by nebulization 2 (two) times daily. 07/26/18   Purohit, Salli Quarry, MD  cefdinir (OMNICEF) 300 MG capsule Take 1 capsule (300 mg total) by mouth 2 (  two) times daily. 06/16/18   Randel Pigg, Dorma Russell, MD  fluticasone [facility]) 50 MCG/ACT nasal spray Place 1 spray into both nostrils daily. 12/22/17   Aviva Kluver B, PA-C  guaiFENesin (ROBITUSSIN) 100 MG/5ML SOLN Take 5 mLs (100 mg total) by mouth every 4 (four) hours as needed for cough or to loosen phlegm. 06/16/18   Lenox Ponds, MD  ipratropium-albuterol (DUONEB) 0.5-2.5 (3) MG/3ML SOLN Take 3 mLs by nebulization every 6 (six) hours as needed (sob and wheezing). 08/20/17   Dorothea Ogle, MD  montelukast (SINGULAIR) 10 MG tablet Take 10 mg by mouth daily. 05/04/18   [provider]    Allergies    Other and Shellfish allergy  Review of Systems   Review of Systems Ten systems are reviewed and are negative for  acute change except as noted in the HPI  Physical Exam Updated Vital Signs BP 108/69 (BP Location: Right Arm)   Pulse 76   Temp 98 F (36.7 C) (Oral)   Resp 16   Ht 5\' 4"  (1.626 m)   Wt 70.3 kg   LMP 10/31/2020   SpO2 100%   BMI 26.61 kg/m   Physical Exam Constitutional:      General: She is not in acute distress.    Appearance: Normal appearance. She is well-developed. She is not ill-appearing or diaphoretic.  HENT:     Head: Normocephalic and atraumatic.  Eyes:     General: Vision grossly intact. Gaze aligned appropriately.     Pupils: Pupils are equal, round, and reactive to light.  Neck:     Trachea: Trachea and phonation normal.  Pulmonary:     Effort: Pulmonary effort is normal. No respiratory distress.  Abdominal:     General: There is no distension.     Palpations: Abdomen is soft.     Tenderness: There is no abdominal tenderness. There is no right CVA tenderness, left CVA tenderness, guarding or rebound. Negative signs include Murphy's sign and McBurney's sign.  Genitourinary:    Comments: Pelvic exam refused by patient. Musculoskeletal:        General: Normal range of motion.     Cervical back: Normal range of motion.  Skin:    General: Skin is warm and dry.  Neurological:     Mental Status: She is alert.     GCS: GCS eye subscore is 4. GCS verbal subscore is 5. GCS motor subscore is 6.     Comments: Speech is clear and goal oriented, follows commands Major Cranial nerves without deficit, no facial droop Moves extremities without ataxia, coordination intact  Psychiatric:        Behavior: Behavior normal.     ED Results / Procedures / Treatments   Labs (all labs ordered are listed, but only abnormal results are displayed) Labs Reviewed  COMPREHENSIVE METABOLIC PANEL - Abnormal; Notable for the following components:      Result Value   CO2 21 (*)    Glucose, Bld 102 (*)    Alkaline Phosphatase 35 (*)    All other components within normal limits   CBC - Abnormal; Notable for the following components:   RBC 5.35 (*)    MCV 74.2 (*)    MCH 23.4 (*)    RDW 15.8 (*)    All other components within normal limits  URINALYSIS, ROUTINE W REFLEX MICROSCOPIC - Abnormal; Notable for the following components:   APPearance CLOUDY (*)    pH 8.5 (*)    Leukocytes,Ua SMALL (*)  All other components within normal limits  URINALYSIS, MICROSCOPIC (REFLEX) - Abnormal; Notable for the following components:   Bacteria, UA MANY (*)    All other components within normal limits  URINALYSIS, ROUTINE W REFLEX MICROSCOPIC - Abnormal; Notable for the following components:   pH 8.5 (*)    Hgb urine dipstick MODERATE (*)    All other components within normal limits  URINALYSIS, MICROSCOPIC (REFLEX) - Abnormal; Notable for the following components:   Bacteria, UA FEW (*)    All other components within normal limits  LIPASE, BLOOD  PREGNANCY, URINE    EKG None  Radiology CT Renal Stone Study  Result Date: 11/21/2020 CLINICAL DATA:  Left flank pain and vomiting today. EXAM: CT ABDOMEN AND PELVIS WITHOUT CONTRAST TECHNIQUE: Multidetector CT imaging of the abdomen and pelvis was performed following the standard protocol without IV contrast. COMPARISON:  None. FINDINGS: Lower chest: The lung bases are clear of acute process. No pleural effusion or pulmonary lesions. The heart is normal in size. No pericardial effusion. The distal esophagus and aorta are unremarkable. Hepatobiliary: No hepatic lesions or intrahepatic biliary dilatation. The gallbladder is unremarkable. No common bile duct dilatation. Pancreas: No mass, inflammation or ductal dilatation. Spleen: Normal size.  No focal lesions. Adrenals/Urinary Tract: Adrenal glands are unremarkable. Small right-sided renal calculi are noted. Mild left-sided hydroureter is noted down to a small, 1-2 mm left UVJ calculus. No bladder calculi. No worrisome renal or bladder lesions without contrast. Stomach/Bowel: The  stomach, duodenum, small bowel and colon are grossly normal without oral contrast. No inflammatory changes, mass lesions or obstructive findings. The appendix is normal. Vascular/Lymphatic: Insert aorta noncontrast Reproductive: The uterus and ovaries are unremarkable. Other: No pelvic mass or adenopathy. No free pelvic fluid collections. No inguinal mass or adenopathy. No abdominal wall hernia or subcutaneous lesions. Musculoskeletal: No significant bony findings. IMPRESSION: 1. 1-2 mm left UVJ calculus causing mild left-sided hydroureter. 2. Small right-sided renal calculi. 3. No worrisome renal or bladder lesions without contrast. Electronically Signed   By: Marijo Sanes M.D.   On: 11/21/2020 15:20    Procedures Procedures (including critical care time)  Medications Ordered in ED Medications  ondansetron (ZOFRAN-ODT) disintegrating tablet 4 mg (4 mg Oral Given 11/21/20 1029)    ED Course  I have reviewed the triage vital signs and the nursing notes.  Pertinent labs & imaging results that were available during my care of the patient were reviewed by me and considered in my medical decision making (see chart for details).    MDM Rules/Calculators/A&P                         Additional history obtained from: 1. Nursing notes from this visit. 2. Review of electronic medical records. -------- 30 year old female presented for nausea vomiting abdominal cramping and left flank pain that began 1 hour after eating [employer] this morning.  Symptoms have completely resolved with Zofran given and triage.  She reports that she is feeling well at this time.  No history of fevers, recent illness or any additional concerns.  On exam she is well-appearing no acute distress, airway clear.  Abdomen is soft nontender, no peritoneal signs, negative Murphy's sign, no McBurney's point tenderness.  Pelvic exam refused by patient.  I reviewed and interpreted labs which include: CBC without leukocytosis or  anemia. CMP shows no emergent electrolyte derangement, AKI, LFT elevations or gap. Lipase within normal limits. Pregnancy test negative. Urinalysis is contaminated shows 21-50  squamous cells, small leukocytes, many bacteria, no blood or hgb.  Patient reports that she did not use the cleansing wipe or collect a midstream urine sample.  Will recollect UA. ---------- Repeat urinalysis now shows hemoglobin and 21-50 RBCs which were not present on the previous UA.  Patient reports she is not currently menstruating.  Given left flank pain nausea vomiting and new hematuria concern for possible kidney stone disease.  Risk versus benefits of CT renal stone study discussed with patient she is agreeable to imaging.  On repeat UA there are no leukocytes nitrates or WBCs to suggest infection. - CT Renal Stone Study:  IMPRESSION:  1. 1-2 mm left UVJ calculus causing mild left-sided hydroureter.  2. Small right-sided renal calculi.  3. No worrisome renal or bladder lesions without contrast.  - Patient updated on CT findings today states understanding this accounts for her hematuria today and left-sided flank and abdominal pain.  Should continue to pass on its own without intervention.  There is no evidence of infection on her repeat UA no indication for antibiotics at this point.  Patient encouraged to call urologist to schedule follow-up appointment, referral given to [facility].  Patient prescribed Flomax to help facilitate stone passage.  Zofran 4 mg ODT every 8 hours as needed.  Short course of Norco (3 pills) prescribed for acute pain secondary to kidney stone, PMD P reviewed no prior narcotic prescriptions.  Patient states understanding of narcotic precautions.  At this time there does not appear to be any evidence of an acute emergency medical condition and the patient appears stable for discharge with appropriate outpatient follow up. Diagnosis was discussed with patient who verbalizes understanding  of care plan and is agreeable to discharge. I have discussed return precautions with patient who verbalizes understanding. Patient encouraged to follow-up with their PCP and Urology. All questions answered.  Patient's case discussed with Dr. Stevie Kern who agrees with plan to discharge with follow-up.   Note: Portions of this report may have been transcribed using voice recognition software. Every effort was made to ensure accuracy; however, inadvertent computerized transcription errors may still be present. Final Clinical Impression(s) / ED Diagnoses Final diagnoses:  Kidney stone on left side    Rx / DC Orders ED Discharge Orders         Ordered    ondansetron (ZOFRAN ODT) 4 MG disintegrating tablet  Every 8 hours PRN        11/21/20 1534    tamsulosin (FLOMAX) 0.4 MG CAPS capsule  Daily        11/21/20 1534    HYDROcodone-acetaminophen (NORCO/VICODIN) 5-325 MG tablet  Every 6 hours PRN        11/21/20 1534           Elizabeth Palau 11/21/20 1537    Milagros Loll, MD 11/28/20 [phone]

## 2020-11-21 NOTE — ED Notes (Signed)
Awaiting for CT Renal to be done

## 2020-11-21 NOTE — ED Triage Notes (Signed)
Pt arrives pov with c/o acute abdominal pain, left flank pain and N/V that started this am pta. Pt deneis diarrhea

## 2022-04-01 IMAGING — CT CT RENAL STONE PROTOCOL
2 of 4 series · 16 of 46 positions shown, 18 images · non-contrast
Comparison: None.

CLINICAL DATA: Left flank pain and vomiting today.

EXAM:
CT ABDOMEN AND PELVIS WITHOUT CONTRAST
TECHNIQUE: Multidetector CT imaging of the abdomen and pelvis was performed
following the standard protocol without IV contrast.

[Series 2: axial st · axial · 0.90mm/px · z∈[-466,-61]mm · 13 of 89 slices shown, 15 images]
[im 4/89  soft-tissue]
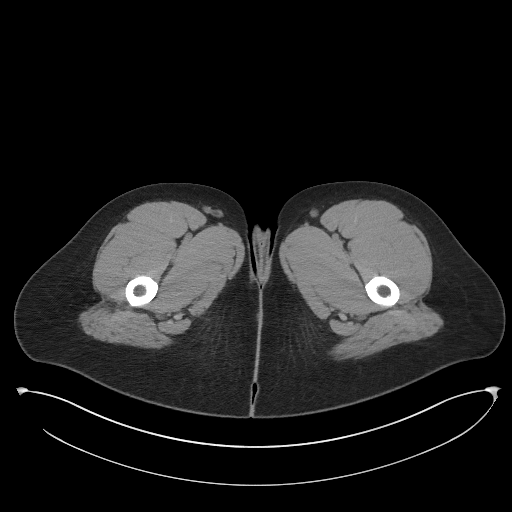
[im 4/89  bone]
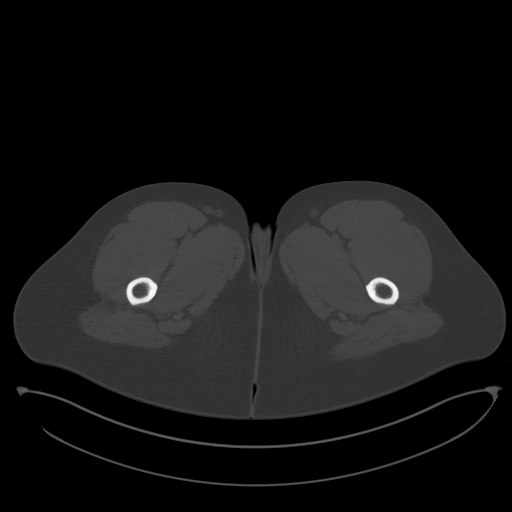
[im 11/89  soft-tissue]
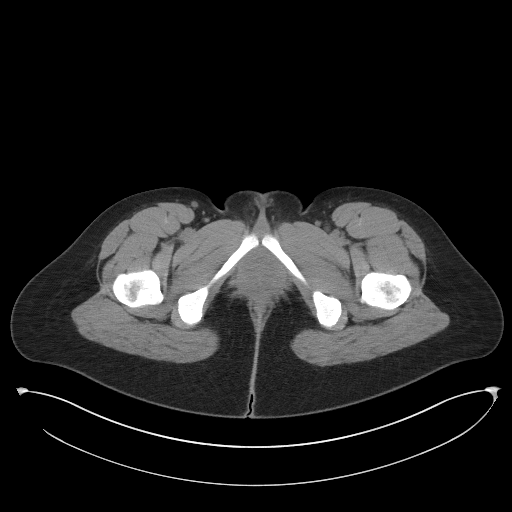
[im 18/89  soft-tissue]
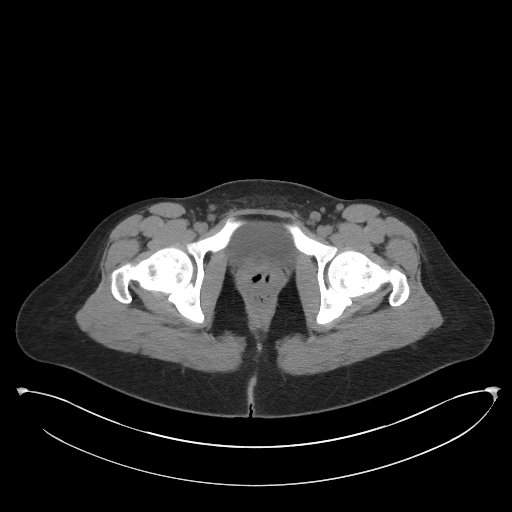
[im 25/89  soft-tissue]
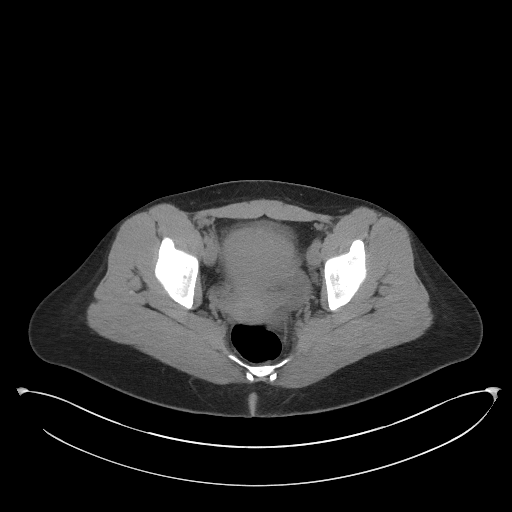
[im 32/89  soft-tissue]
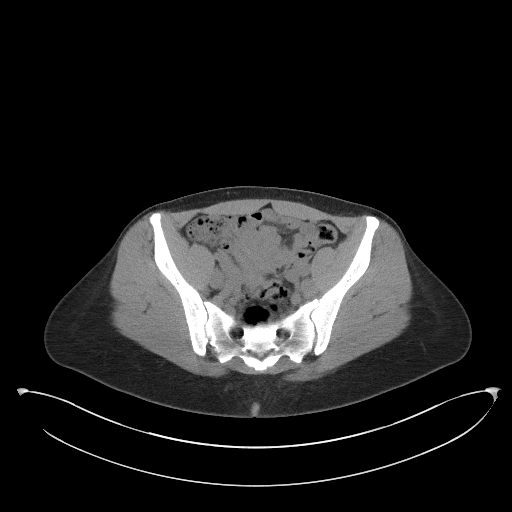
[im 39/89  soft-tissue]
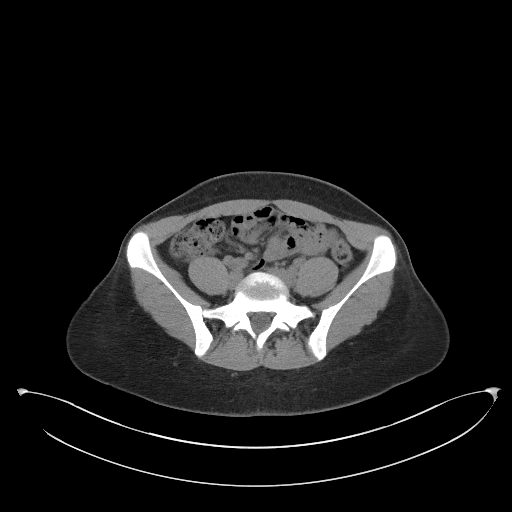
[im 46/89  soft-tissue]
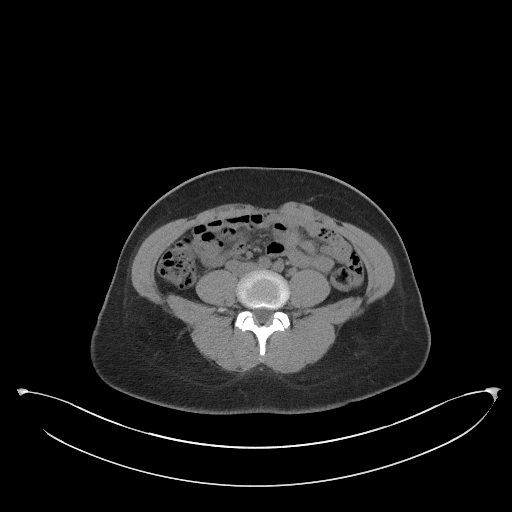
[im 50/89  soft-tissue]
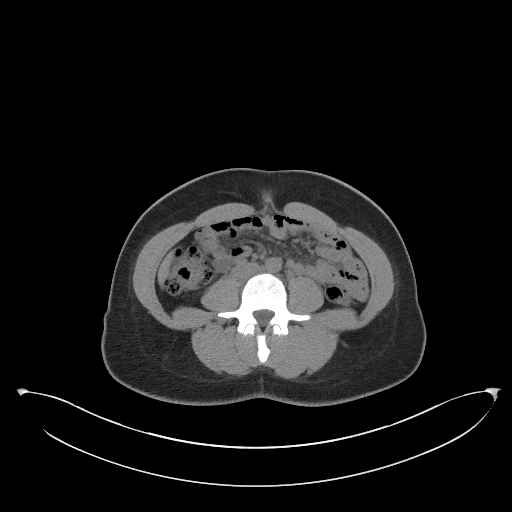
[im 57/89  soft-tissue]
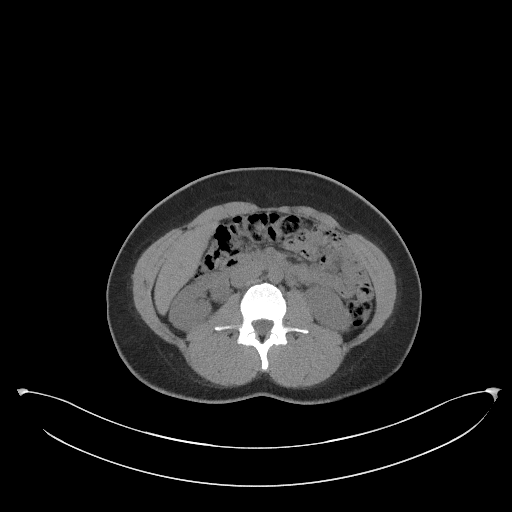
[im 57/89  bone]
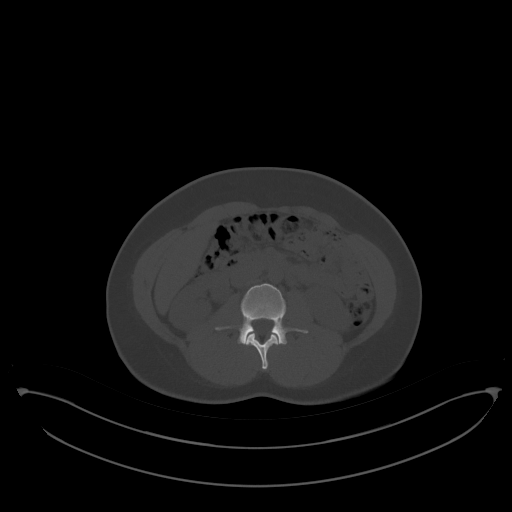
[im 64/89  soft-tissue]
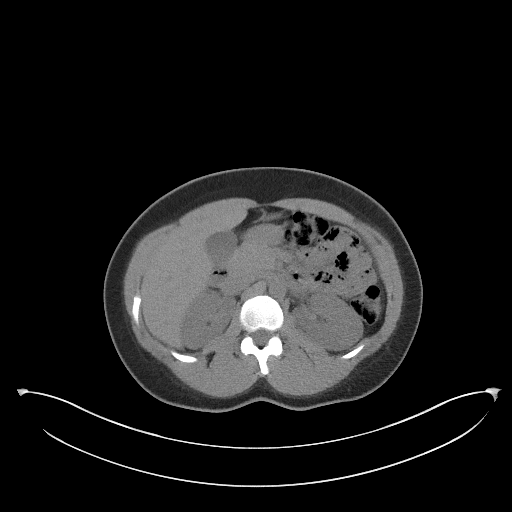
[im 71/89  soft-tissue]
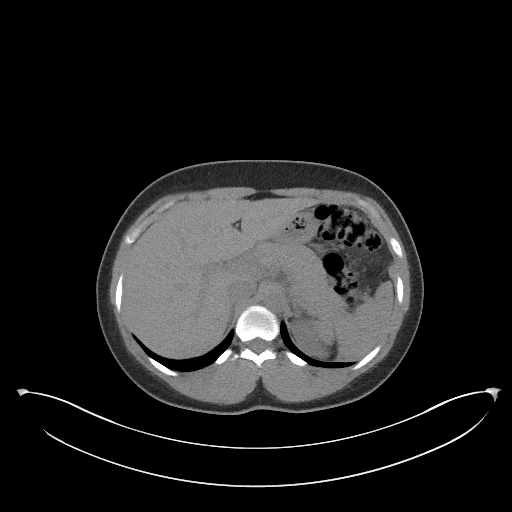
[im 78/89  soft-tissue]
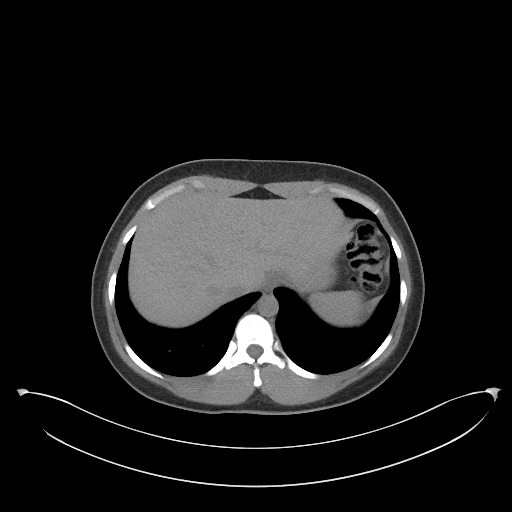
[im 85/89  soft-tissue]
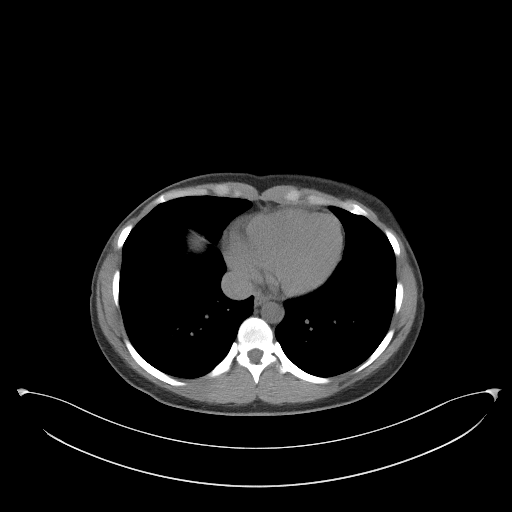

[Series 5: coronal st · coronal · 0.72mm/px · 3 of 79 slices shown]
[im 27/79  soft-tissue]
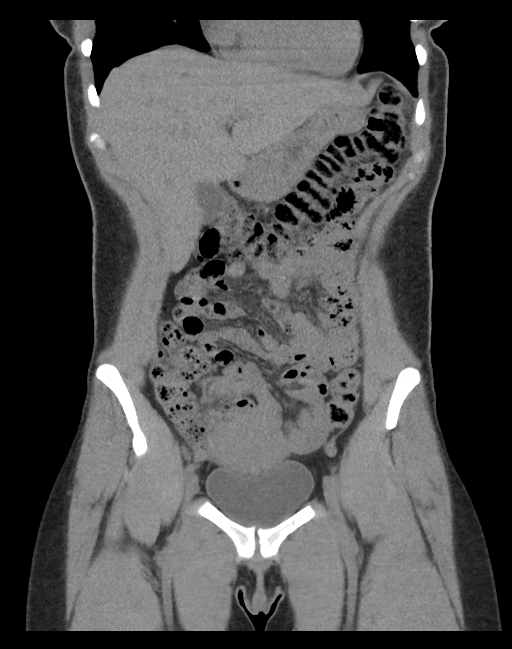
[im 35/79  soft-tissue]
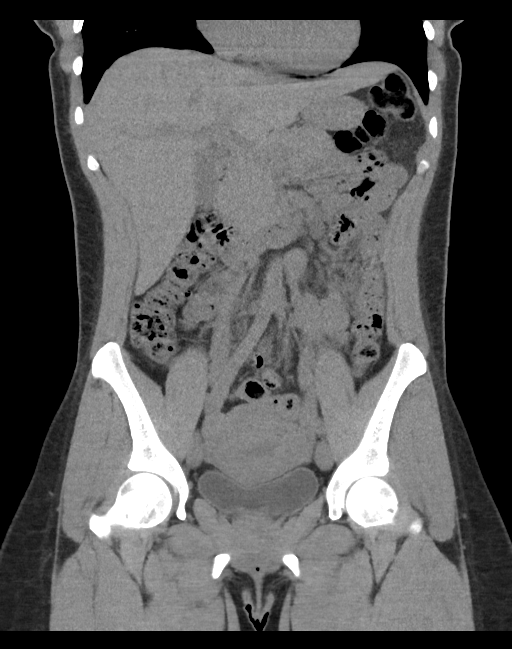
[im 44/79  soft-tissue]
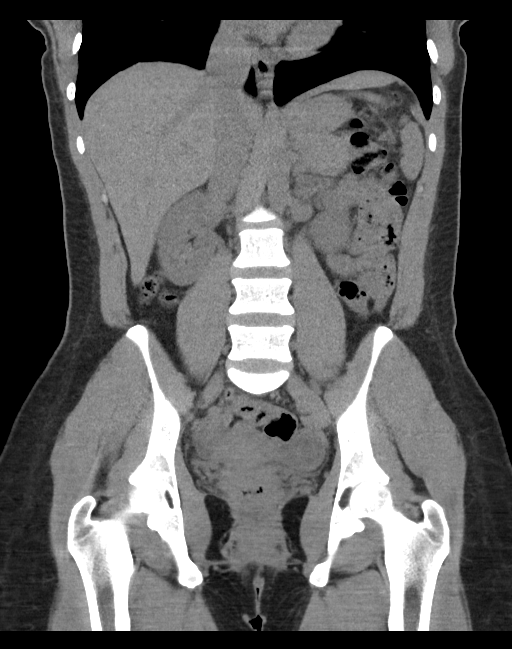

[16 of 46 positions shown; findings below may reference images not displayed]

FINDINGS: Lower chest: The lung bases are clear of acute process. No pleural
effusion or pulmonary lesions. The heart is normal in size. No
pericardial effusion. The distal esophagus and aorta are
unremarkable.

Hepatobiliary: No hepatic lesions or intrahepatic biliary
dilatation. The gallbladder is unremarkable. No common bile duct
dilatation.

Pancreas: No mass, inflammation or ductal dilatation.

Spleen: Normal size.  No focal lesions.

Adrenals/Urinary Tract: Adrenal glands are unremarkable.

Small right-sided renal calculi are noted. Mild left-sided
hydroureter is noted down to a small, 1-2 mm left UVJ calculus. No
bladder calculi. No worrisome renal or bladder lesions without
contrast.

Stomach/Bowel: The stomach, duodenum, small bowel and colon are
grossly normal without oral contrast. No inflammatory changes, mass
lesions or obstructive findings. The appendix is normal.

Vascular/Lymphatic: Insert aorta noncontrast

Reproductive: The uterus and ovaries are unremarkable.

Other: No pelvic mass or adenopathy. No free pelvic fluid
collections. No inguinal mass or adenopathy. No abdominal wall
hernia or subcutaneous lesions.

Musculoskeletal: No significant bony findings.
IMPRESSION: 1. 1-2 mm left UVJ calculus causing mild left-sided hydroureter.
2. Small right-sided renal calculi.
3. No worrisome renal or bladder lesions without contrast.

## 2024-02-24 ENCOUNTER — Other Ambulatory Visit: Payer: Self-pay

## 2024-02-24 ENCOUNTER — Emergency Department (HOSPITAL_COMMUNITY)

## 2024-02-24 ENCOUNTER — Inpatient Hospital Stay (HOSPITAL_COMMUNITY)
Admission: EM | Admit: 2024-02-24 | Discharge: 2024-02-26 | DRG: 203 | Disposition: A | Discharge status: Home / Self Care | Attending: Internal Medicine | Admitting: Internal Medicine

## 2024-02-24 ENCOUNTER — Encounter (HOSPITAL_COMMUNITY): Payer: Self-pay | Admitting: *Deleted

## 2024-02-24 DIAGNOSIS — J4541 Moderate persistent asthma with (acute) exacerbation: Secondary | ICD-10-CM | POA: Diagnosis not present

## 2024-02-24 DIAGNOSIS — Z91013 Allergy to seafood: Secondary | ICD-10-CM

## 2024-02-24 DIAGNOSIS — Z79899 Other long term (current) drug therapy: Secondary | ICD-10-CM

## 2024-02-24 DIAGNOSIS — J45901 Unspecified asthma with (acute) exacerbation: Secondary | ICD-10-CM | POA: Diagnosis present

## 2024-02-24 DIAGNOSIS — Z1152 Encounter for screening for COVID-19: Secondary | ICD-10-CM

## 2024-02-24 DIAGNOSIS — E669 Obesity, unspecified: Secondary | ICD-10-CM | POA: Diagnosis present

## 2024-02-24 DIAGNOSIS — E876 Hypokalemia: Secondary | ICD-10-CM | POA: Diagnosis present

## 2024-02-24 DIAGNOSIS — Z7951 Long term (current) use of inhaled steroids: Secondary | ICD-10-CM

## 2024-02-24 DIAGNOSIS — Z6830 Body mass index (BMI) 30.0-30.9, adult: Secondary | ICD-10-CM

## 2024-02-24 DIAGNOSIS — Z825 Family history of asthma and other chronic lower respiratory diseases: Secondary | ICD-10-CM

## 2024-02-24 LAB — I-STAT CHEM 8, ED
BUN: 5 mg/dL — ABNORMAL LOW (ref 6–20)
Calcium, Ion: 1.03 mmol/L — ABNORMAL LOW (ref 1.15–1.40)
Chloride: 111 mmol/L (ref 98–111)
Creatinine, Ser: 0.9 mg/dL (ref 0.44–1.00)
Glucose, Bld: 114 mg/dL — ABNORMAL HIGH (ref 70–99)
HCT: 41 % (ref 36.0–46.0)
Hemoglobin: 13.9 g/dL (ref 12.0–15.0)
Potassium: 3.2 mmol/L — ABNORMAL LOW (ref 3.5–5.1)
Sodium: 141 mmol/L (ref 135–145)
TCO2: 18 mmol/L — ABNORMAL LOW (ref 22–32)

## 2024-02-24 LAB — RESP PANEL BY RT-PCR (RSV, FLU A&B, COVID)  RVPGX2
Influenza A by PCR: NEGATIVE
Influenza B by PCR: NEGATIVE
Resp Syncytial Virus by PCR: NEGATIVE
SARS Coronavirus 2 by RT PCR: NEGATIVE

## 2024-02-24 LAB — CBC WITH DIFFERENTIAL/PLATELET
Abs Immature Granulocytes: 0.04 10*3/uL (ref 0.00–0.07)
Basophils Absolute: 0.1 10*3/uL (ref 0.0–0.1)
Basophils Relative: 1 %
Eosinophils Absolute: 0.1 10*3/uL (ref 0.0–0.5)
Eosinophils Relative: 1 %
HCT: 40.2 % (ref 36.0–46.0)
Hemoglobin: 12.4 g/dL (ref 12.0–15.0)
Immature Granulocytes: 0 %
Lymphocytes Relative: 4 %
Lymphs Abs: 0.4 10*3/uL — ABNORMAL LOW (ref 0.7–4.0)
MCH: 23.5 pg — ABNORMAL LOW (ref 26.0–34.0)
MCHC: 30.8 g/dL (ref 30.0–36.0)
MCV: 76.3 fL — ABNORMAL LOW (ref 80.0–100.0)
Monocytes Absolute: 0.4 10*3/uL (ref 0.1–1.0)
Monocytes Relative: 4 %
Neutro Abs: 9 10*3/uL — ABNORMAL HIGH (ref 1.7–7.7)
Neutrophils Relative %: 90 %
Platelets: 367 10*3/uL (ref 150–400)
RBC: 5.27 MIL/uL — ABNORMAL HIGH (ref 3.87–5.11)
RDW: 14.4 % (ref 11.5–15.5)
WBC: 9.9 10*3/uL (ref 4.0–10.5)
nRBC: 0 % (ref 0.0–0.2)

## 2024-02-24 LAB — I-STAT VENOUS BLOOD GAS, ED
Acid-base deficit: 5 mmol/L — ABNORMAL HIGH (ref 0.0–2.0)
Bicarbonate: 18 mmol/L — ABNORMAL LOW (ref 20.0–28.0)
Calcium, Ion: 1.04 mmol/L — ABNORMAL LOW (ref 1.15–1.40)
HCT: 40 % (ref 36.0–46.0)
Hemoglobin: 13.6 g/dL (ref 12.0–15.0)
O2 Saturation: 75 %
Potassium: 3.2 mmol/L — ABNORMAL LOW (ref 3.5–5.1)
Sodium: 141 mmol/L (ref 135–145)
TCO2: 19 mmol/L — ABNORMAL LOW (ref 22–32)
pCO2, Ven: 27.8 mmHg — ABNORMAL LOW (ref 44–60)
pH, Ven: 7.418 (ref 7.25–7.43)
pO2, Ven: 38 mmHg (ref 32–45)

## 2024-02-24 LAB — BASIC METABOLIC PANEL WITH GFR
Anion gap: 15 (ref 5–15)
BUN: 5 mg/dL — ABNORMAL LOW (ref 6–20)
CO2: 17 mmol/L — ABNORMAL LOW (ref 22–32)
Calcium: 8.7 mg/dL — ABNORMAL LOW (ref 8.9–10.3)
Chloride: 109 mmol/L (ref 98–111)
Creatinine, Ser: 0.9 mg/dL (ref 0.44–1.00)
GFR, Estimated: >60 mL/min (ref >60)
Glucose, Bld: 118 mg/dL — ABNORMAL HIGH (ref 70–99)
Potassium: 3.3 mmol/L — ABNORMAL LOW (ref 3.5–5.1)
Sodium: 141 mmol/L (ref 135–145)

## 2024-02-24 LAB — HCG, SERUM, QUALITATIVE: Preg, Serum: NEGATIVE

## 2024-02-24 LAB — D-DIMER, QUANTITATIVE: D-Dimer, Quant: 0.47 ug{FEU}/mL (ref 0.00–0.50)

## 2024-02-24 MED ORDER — ARFORMOTEROL TARTRATE 15 MCG/2ML IN NEBU
15.0000 ug | INHALATION_SOLUTION | Freq: Two times a day (BID) | RESPIRATORY_TRACT | Status: DC
  Administered 2024-02-24: 15 ug via RESPIRATORY_TRACT
  Filled 2024-02-24: qty 2

## 2024-02-24 MED ORDER — ENOXAPARIN SODIUM 40 MG/0.4ML IJ SOSY
40.0000 mg | PREFILLED_SYRINGE | INTRAMUSCULAR | Status: DC
  Administered 2024-02-24 – 2024-02-25 (×2): 40 mg via SUBCUTANEOUS
  Filled 2024-02-24 (×2): qty 0.4

## 2024-02-24 MED ORDER — MAGNESIUM SULFATE 2 GM/50ML IV SOLN
2.0000 g | Freq: Once | INTRAVENOUS | Status: AC
  Administered 2024-02-24: 2 g via INTRAVENOUS
  Filled 2024-02-24: qty 50

## 2024-02-24 MED ORDER — ALBUTEROL SULFATE (2.5 MG/3ML) 0.083% IN NEBU
15.0000 mg/h | INHALATION_SOLUTION | Freq: Once | RESPIRATORY_TRACT | Status: AC
Start: 2024-02-24 — End: 2024-02-24
  Administered 2024-02-24: 15 mg/h via RESPIRATORY_TRACT
  Filled 2024-02-24: qty 3

## 2024-02-24 MED ORDER — MELATONIN 5 MG PO TABS: 5.0000 mg | ORAL_TABLET | Freq: Every evening | ORAL | Status: DC | PRN

## 2024-02-24 MED ORDER — ALBUTEROL SULFATE (2.5 MG/3ML) 0.083% IN NEBU
2.5000 mg | INHALATION_SOLUTION | Freq: Once | RESPIRATORY_TRACT | Status: AC
  Administered 2024-02-24: 2.5 mg via RESPIRATORY_TRACT
  Filled 2024-02-24: qty 3

## 2024-02-24 MED ORDER — IPRATROPIUM-ALBUTEROL 0.5-2.5 (3) MG/3ML IN SOLN
3.0000 mL | RESPIRATORY_TRACT | Status: DC
  Administered 2024-02-24 – 2024-02-25 (×4): 3 mL via RESPIRATORY_TRACT
  Filled 2024-02-24 (×4): qty 3

## 2024-02-24 MED ORDER — ACETAMINOPHEN 325 MG PO TABS
650.0000 mg | ORAL_TABLET | Freq: Four times a day (QID) | ORAL | Status: DC | PRN
  Administered 2024-02-24 – 2024-02-25 (×2): 650 mg via ORAL
  Filled 2024-02-24 (×2): qty 2

## 2024-02-24 MED ORDER — POTASSIUM CHLORIDE CRYS ER 20 MEQ PO TBCR
40.0000 meq | EXTENDED_RELEASE_TABLET | ORAL | Status: AC
Start: 2024-02-24 — End: 2024-02-25
  Administered 2024-02-24 – 2024-02-25 (×2): 40 meq via ORAL
  Filled 2024-02-24 (×2): qty 2

## 2024-02-24 MED ORDER — PROCHLORPERAZINE EDISYLATE 10 MG/2ML IJ SOLN
5.0000 mg | Freq: Four times a day (QID) | INTRAMUSCULAR | Status: DC | PRN
  Administered 2024-02-24: 5 mg via INTRAVENOUS
  Filled 2024-02-24: qty 2

## 2024-02-24 MED ORDER — ONDANSETRON HCL 4 MG/2ML IJ SOLN
4.0000 mg | Freq: Once | INTRAMUSCULAR | Status: AC
  Administered 2024-02-24: 4 mg via INTRAVENOUS
  Filled 2024-02-24: qty 2

## 2024-02-24 MED ORDER — BUDESONIDE 0.5 MG/2ML IN SUSP
0.5000 mg | Freq: Two times a day (BID) | RESPIRATORY_TRACT | Status: DC
  Administered 2024-02-24: 0.5 mg via RESPIRATORY_TRACT
  Filled 2024-02-24: qty 2

## 2024-02-24 MED ORDER — IPRATROPIUM BROMIDE 0.02 % IN SOLN
1.0000 mg | Freq: Once | RESPIRATORY_TRACT | Status: AC
  Administered 2024-02-24: 1 mg via RESPIRATORY_TRACT
  Filled 2024-02-24: qty 5

## 2024-02-24 MED ORDER — SODIUM CHLORIDE 0.9 % IV BOLUS
1000.0000 mL | Freq: Once | INTRAVENOUS | Status: AC
  Administered 2024-02-24: 1000 mL via INTRAVENOUS

## 2024-02-24 MED ORDER — ALBUTEROL SULFATE (2.5 MG/3ML) 0.083% IN NEBU
5.0000 mg | INHALATION_SOLUTION | Freq: Once | RESPIRATORY_TRACT | Status: AC
  Administered 2024-02-24: 5 mg via RESPIRATORY_TRACT
  Filled 2024-02-24: qty 6

## 2024-02-24 MED ORDER — MONTELUKAST SODIUM 10 MG PO TABS
10.0000 mg | ORAL_TABLET | Freq: Every day | ORAL | Status: DC
  Administered 2024-02-25 – 2024-02-26 (×2): 10 mg via ORAL
  Filled 2024-02-24 (×2): qty 1

## 2024-02-24 MED ORDER — POLYETHYLENE GLYCOL 3350 17 G PO PACK: 17.0000 g | PACK | Freq: Every day | ORAL | Status: DC | PRN

## 2024-02-24 MED ORDER — METHYLPREDNISOLONE SODIUM SUCC 40 MG IJ SOLR
40.0000 mg | INTRAMUSCULAR | Status: DC
  Administered 2024-02-24 – 2024-02-25 (×2): 40 mg via INTRAVENOUS
  Filled 2024-02-24 (×2): qty 1

## 2024-02-24 MED ORDER — METHYLPREDNISOLONE SODIUM SUCC 125 MG IJ SOLR
125.0000 mg | Freq: Once | INTRAMUSCULAR | Status: AC
  Administered 2024-02-24: 125 mg via INTRAVENOUS
  Filled 2024-02-24: qty 2

## 2024-02-24 NOTE — ED Notes (Signed)
 Pt venous gas results flagged hannah m.rn notified by at

## 2024-02-24 NOTE — Plan of Care (Signed)

## 2024-02-24 NOTE — ED Provider Notes (Signed)
 [place] EMERGENCY DEPARTMENT AT [facility] Provider Note   CSN: [id number] Arrival date & time: 02/24/24  1122     History  Chief Complaint  Patient presents with   Asthma    Laura [name] is a 33 y.o. female hx of asthma with previous intubation in 2019, here presenting with 1 day of breath.  Patient states that she has been short of breath since yesterday.  Patient has been using her inhaler at home with no relief.  Denies any fevers or chills.  Patient states that she has no sick contacts.  Patient was admitted and intubated in 2019 for severe asthma.  Patient follows up with primary care doctor at [facility] but does not have a pulmonologist  The history is provided by the patient.       Home Medications Prior to Admission medications   Medication Sig Start Date End Date Taking? Authorizing Provider  albuterol (PROVENTIL HFA;VENTOLIN HFA) 108 (90 Base) MCG/ACT inhaler Inhale 1-2 puffs into the lungs every 6 (six) hours as needed for wheezing or shortness of breath. 07/26/18   Purohit, Salli Quarry, MD  arformoterol (BROVANA) 15 MCG/2ML NEBU Take 2 mLs (15 mcg total) by nebulization 2 (two) times daily. 07/26/18   Purohit, Salli Quarry, MD  budesonide (PULMICORT) 0.5 MG/2ML nebulizer solution Take 2 mLs (0.5 mg total) by nebulization 2 (two) times daily. 07/26/18   Purohit, Salli Quarry, MD  cefdinir (OMNICEF) 300 MG capsule Take 1 capsule (300 mg total) by mouth 2 (two) times daily. 06/16/18   Randel Pigg, Dorma Russell, MD  fluticasone [employer]) 50 MCG/ACT nasal spray Place 1 spray into both nostrils daily. 12/22/17   Aviva Kluver B, PA-C  guaiFENesin (ROBITUSSIN) 100 MG/5ML SOLN Take 5 mLs (100 mg total) by mouth every 4 (four) hours as needed for cough or to loosen phlegm. 06/16/18   Lenox Ponds, MD  ipratropium-albuterol (DUONEB) 0.5-2.5 (3) MG/3ML SOLN Take 3 mLs by nebulization every 6 (six) hours as needed (sob and wheezing). 08/20/17   Dorothea Ogle, MD  montelukast (SINGULAIR)  10 MG tablet Take 10 mg by mouth daily. 05/04/18   [provider]      Allergies    Other and Shellfish allergy    Review of Systems   Review of Systems  Respiratory:  Positive for shortness of breath.   All other systems reviewed and are negative.   Physical Exam Updated Vital Signs BP 122/80 (BP Location: Left Arm)   Pulse (!) 109   Temp 98.2 F (36.8 C) (Oral)   Resp (!) 22   Ht 5\' 4"  (1.626 m)   Wt 81.6 kg   SpO2 93%   BMI 30.90 kg/m  Physical Exam Vitals and nursing note reviewed.  Constitutional:      Comments: Tachypneic and moderate distress  HENT:     Head: Normocephalic.     Nose: Nose normal.     Mouth/Throat:     Mouth: Mucous membranes are moist.  Eyes:     Extraocular Movements: Extraocular movements intact.     Pupils: Pupils are equal, round, and reactive to light.  Cardiovascular:     Rate and Rhythm: Regular rhythm. Tachycardia present.     Pulses: Normal pulses.  Pulmonary:     Comments: Tachypneic and diffuse wheezing with mild retractions Abdominal:     General: Abdomen is flat.     Palpations: Abdomen is soft.  Musculoskeletal:        General: Normal  range of motion.     Cervical back: Normal range of motion and neck supple.  Skin:    General: Skin is warm.     Capillary Refill: Capillary refill takes less than 2 seconds.  Neurological:     General: No focal deficit present.  Psychiatric:        Mood and Affect: Mood normal.     ED Results / Procedures / Treatments   Labs (all labs ordered are listed, but only abnormal results are displayed) Labs Reviewed  RESP PANEL BY RT-PCR (RSV, FLU A&B, COVID)  [id number]  CBC WITH DIFFERENTIAL/PLATELET  BASIC METABOLIC PANEL WITH GFR  HCG, SERUM, QUALITATIVE  I-STAT VENOUS BLOOD GAS, ED  I-STAT CHEM 8, ED    EKG None  Radiology DG Chest 2 View Result Date: 02/24/2024 CLINICAL DATA:  sob, asthma EXAM: CHEST - 2 VIEW COMPARISON:  None Available. FINDINGS: The cardiomediastinal  contours are normal. Mild bronchial thickening. Pulmonary vasculature is normal. No consolidation, pleural effusion, or pneumothorax. No acute osseous abnormalities are seen. IMPRESSION: Mild bronchial thickening. Electronically Signed   By: Narda Rutherford M.D.   On: 02/24/2024 12:25    Procedures Procedures    CRITICAL CARE Performed by: Richardean Canal   Total critical care time: 47 minutes  Critical care time was exclusive of separately billable procedures and treating other patients.  Critical care was necessary to treat or prevent imminent or life-threatening deterioration.  Critical care was time spent personally by me on the following activities: development of treatment plan with patient and/or surrogate as well as nursing, discussions with consultants, evaluation of patient's response to treatment, examination of patient, obtaining history from patient or surrogate, ordering and performing treatments and interventions, ordering and review of laboratory studies, ordering and review of radiographic studies, pulse oximetry and re-evaluation of patient's condition.   Medications Ordered in ED Medications  albuterol (PROVENTIL) (2.5 MG/3ML) 0.083% nebulizer solution 2.5 mg (2.5 mg Nebulization Given 02/24/24 1141)  sodium chloride 0.9 % bolus 1,000 mL (1,000 mLs Intravenous New Bag/Given 02/24/24 1629)  magnesium sulfate IVPB 2 g 50 mL (0 g Intravenous Stopped 02/24/24 1740)  methylPREDNISolone sodium succinate (SOLU-MEDROL) 125 mg/2 mL injection 125 mg (125 mg Intravenous Given 02/24/24 1626)  albuterol (PROVENTIL) (2.5 MG/3ML) 0.083% nebulizer solution (15 mg/hr Nebulization Given 02/24/24 1626)  ipratropium (ATROVENT) nebulizer solution 1 mg (1 mg Nebulization Given 02/24/24 1629)    ED Course/ Medical Decision Making/ A&P                                 Medical Decision Making BRYNLEE [name] is a 33 y.o. female here presenting with shortness of breath.  Patient has history of asthma  requiring intubation previously.  Patient is tachypneic with retractions.  Concern for possible asthma exacerbation versus pneumonia versus COVID.  Patient has no recent travel to suggest PE but she is very tachycardic.  Plan to get CBC CMP and D-dimer and VBG and chest x-ray and COVID test.  Will give continuous neb and Solu-Medrol and magnesium and reassess.  5:47 PM Patient is still tachypneic despite nebs and Solu-Medrol and magnesium.  Patient no longer has retractions but still wheezing.  Labs pending and patient anticipating will need admission  6:52 PM I reviewed patient's labs and VBG is reassuring.  D-dimer is negative.  Patient has negative COVID and flu and RSV test and chest x-ray did not show any pneumonia.  Patient is still tachypneic despite multiple rounds of nebs.  Patient will be admitted for asthma exacerbation.  Problems Addressed: Moderate persistent asthma with acute exacerbation: acute illness or injury  Amount and/or Complexity of Data Reviewed Labs: ordered. Decision-making details documented in ED Course. Radiology: ordered and independent interpretation performed. Decision-making details documented in ED Course.  Risk Prescription drug management. Decision regarding hospitalization.    Final Clinical Impression(s) / ED Diagnoses Final diagnoses:  None    Rx / DC Orders ED Discharge Orders     None         Charlynne Pander, MD 02/24/24 [phone]

## 2024-02-24 NOTE — ED Triage Notes (Signed)
 Pt with hx of asthma is having increasing sob not resolved with inhaler at home.  No fever

## 2024-02-24 NOTE — H&P (Incomplete)
 History and Physical  Laura [name]:[id number] DOB: [birth year] DOA: 02/24/2024  Referring physician: Dr. Silverio Lay, EDP  PCP: Pcp, No  Outpatient Specialists: Cardiology, allergy. Patient coming from: Home.  Chief Complaint: Shortness of breath.  HPI: Laura [name] is a 33 y.o. female with medical history significant for obesity, asthma (intubated in 2019 after an severe asthma exacerbation), who presents to the ER with sudden onset dyspnea since yesterday.  She used her home inhaler without much improvement.  Due to worsening symptoms she presented to the ER for further evaluation.  No reported subjective fevers or chills.  In the ER, tachycardic tachypneic with increased work of breathing.  COVID-19 screening test by PCR, influenza A&B by PCR, RSV by PCR, D-dimer, all negative.  Chest x-ray showing mild bronchial thickening.  She is afebrile with no leukocytosis.  The patient received IV Solu-Medrol, IV magnesium, continuous DuoNeb treatment with improvement.  She no longer has increased work of breathing.  Due to concern for acute asthma exacerbation, EDP requested admission for further management.  Admitted by [facility], hospitalist service.  ED Course: Temperature 98.2.  BP 147/78, pulse 111, respiration rate 20 from 37, O2 saturation 99% on room air.  Lab studies notable for WBC 9.9, hemoglobin 12.4, MCV 76.3, platelet count 367.  Serum potassium 3.2, BUN 5, creatinine 0.90 with GFR grain 60.  Review of Systems: Review of systems as noted in the HPI. All other systems reviewed and are negative.   Past Medical History:  Diagnosis Date   Asthma    History reviewed. No pertinent surgical history.  Social History:  reports that she is a non-smoker but has been exposed to tobacco smoke. She has never used smokeless tobacco. She reports that she does not currently use alcohol. She reports that she does not use drugs.   Allergies  Allergen Reactions   Other Swelling    Seafood, facial  swelling Facial swelling, mouth swelling    Shellfish Allergy     Family History  Problem Relation Age of Onset   Asthma Father    Asthma Maternal Grandmother       Prior to Admission medications   Medication Sig Start Date End Date Taking? Authorizing Provider  albuterol (PROVENTIL HFA;VENTOLIN HFA) 108 (90 Base) MCG/ACT inhaler Inhale 1-2 puffs into the lungs every 6 (six) hours as needed for wheezing or shortness of breath. 07/26/18   Purohit, Salli Quarry, MD  arformoterol (BROVANA) 15 MCG/2ML NEBU Take 2 mLs (15 mcg total) by nebulization 2 (two) times daily. 07/26/18   Purohit, Salli Quarry, MD  budesonide (PULMICORT) 0.5 MG/2ML nebulizer solution Take 2 mLs (0.5 mg total) by nebulization 2 (two) times daily. 07/26/18   Purohit, Salli Quarry, MD  cefdinir (OMNICEF) 300 MG capsule Take 1 capsule (300 mg total) by mouth 2 (two) times daily. 06/16/18   Randel Pigg, Dorma Russell, MD  fluticasone [facility]) 50 MCG/ACT nasal spray Place 1 spray into both nostrils daily. 12/22/17   Aviva Kluver B, PA-C  guaiFENesin (ROBITUSSIN) 100 MG/5ML SOLN Take 5 mLs (100 mg total) by mouth every 4 (four) hours as needed for cough or to loosen phlegm. 06/16/18   Lenox Ponds, MD  ipratropium-albuterol (DUONEB) 0.5-2.5 (3) MG/3ML SOLN Take 3 mLs by nebulization every 6 (six) hours as needed (sob and wheezing). 08/20/17   Dorothea Ogle, MD  montelukast (SINGULAIR) 10 MG tablet Take 10 mg by mouth daily. 05/04/18   [provider]    Physical Exam: BP (!) 147/78  Pulse (!) 102   Temp 98.2 F (36.8 C) (Oral)   Resp (!) 23   Ht 5\' 4"  (1.626 m)   Wt 81.6 kg   SpO2 100%   BMI 30.90 kg/m   General: 33 y.o. year-old female well developed well nourished in no acute distress.  Alert and oriented x3. Cardiovascular: Regular rate and rhythm with no rubs or gallops.  No thyromegaly or JVD noted.  No lower extremity edema. 2/4 pulses in all 4 extremities. Respiratory: Diffuse wheezing bilaterally.  Poor inspiratory  effort. Abdomen: Soft nontender nondistended with normal bowel sounds x4 quadrants. Muskuloskeletal: No cyanosis, clubbing or edema noted bilaterally Neuro: CN II-XII intact, strength, sensation, reflexes Skin: No ulcerative lesions noted or rashes Psychiatry: Judgement and insight appear normal. Mood is appropriate for condition and setting          Labs on Admission:  Basic Metabolic Panel: Recent Labs  Lab 02/24/24 1747 02/24/24 1752 02/24/24 1753  NA 141 141 141  K 3.3* 3.2* 3.2*  CL 109  --  111  CO2 17*  --   --   GLUCOSE 118*  --  114*  BUN 5*  --  5*  CREATININE 0.90  --  0.90  CALCIUM 8.7*  --   --    Liver Function Tests: No results for input(s): "AST", "ALT", "ALKPHOS", "BILITOT", "PROT", "ALBUMIN" in the last 168 hours. No results for input(s): "LIPASE", "AMYLASE" in the last 168 hours. No results for input(s): "AMMONIA" in the last 168 hours. CBC: Recent Labs  Lab 02/24/24 1747 02/24/24 1752 02/24/24 1753  WBC 9.9  --   --   NEUTROABS 9.0*  --   --   HGB 12.4 13.6 13.9  HCT 40.2 40.0 41.0  MCV 76.3*  --   --   PLT 367  --   --    Cardiac Enzymes: No results for input(s): "CKTOTAL", "CKMB", "CKMBINDEX", "TROPONINI" in the last 168 hours.  BNP (last 3 results) No results for input(s): "BNP" in the last 8760 hours.  ProBNP (last 3 results) No results for input(s): "PROBNP" in the last 8760 hours.  CBG: No results for input(s): "GLUCAP" in the last 168 hours.  Radiological Exams on Admission: DG Chest 2 View Result Date: 02/24/2024 CLINICAL DATA:  sob, asthma EXAM: CHEST - 2 VIEW COMPARISON:  None Available. FINDINGS: The cardiomediastinal contours are normal. Mild bronchial thickening. Pulmonary vasculature is normal. No consolidation, pleural effusion, or pneumothorax. No acute osseous abnormalities are seen. IMPRESSION: Mild bronchial thickening. Electronically Signed   By: Narda Rutherford M.D.   On: 02/24/2024 12:25    EKG: I independently  viewed the EKG done and my findings are as followed: None available at the time of this visit.  Assessment/Plan Present on Admission:  Acute asthma exacerbation  Principal Problem:   Acute asthma exacerbation  Acute asthma exacerbation, unclear trigger Symptoms onset yesterday Denies use of tobacco or exposure to secondhand smoking Thinks her asthma exacerbation may have been triggered by pollen or other environmental allergens Chest x-ray shows mild bronchial thickening. COVID-19 screening test by PCR, influenza A&B by PCR, RSV by PCR, D-dimer, all negative Continue IV Solu-Medrol, and DuoNeb treatments. Early mobilization  Hypokalemia Serum potassium 3.2 Repleted orally with KCl 40 mEq twice daily x 2 doses. Check magnesium level in the morning Also check phosphorus level.  Obesity BMI 30 Recommend weight loss outpatient with regular physical activity and healthy dieting.   Time: 75 minutes.   DVT prophylaxis: Subcu  Lovenox daily  Code Status: Full code.  Family Communication: None at bedside.  Disposition Plan: Admitted to telemetry medical unit.  Consults called: None.  Admission status: Observation status.   Status is: Observation    Darlin Drop MD [facility] Pager [phone]  If 7PM-7AM, please contact night-coverage [email or web address] Password [facility]  02/24/2024, 7:35 PM

## 2024-02-25 DIAGNOSIS — Z79899 Other long term (current) drug therapy: Secondary | ICD-10-CM | POA: Diagnosis not present

## 2024-02-25 DIAGNOSIS — Z91013 Allergy to seafood: Secondary | ICD-10-CM | POA: Diagnosis not present

## 2024-02-25 DIAGNOSIS — E669 Obesity, unspecified: Secondary | ICD-10-CM | POA: Diagnosis present

## 2024-02-25 DIAGNOSIS — J45901 Unspecified asthma with (acute) exacerbation: Secondary | ICD-10-CM | POA: Diagnosis present

## 2024-02-25 DIAGNOSIS — Z1152 Encounter for screening for COVID-19: Secondary | ICD-10-CM | POA: Diagnosis not present

## 2024-02-25 DIAGNOSIS — Z825 Family history of asthma and other chronic lower respiratory diseases: Secondary | ICD-10-CM | POA: Diagnosis not present

## 2024-02-25 DIAGNOSIS — Z7951 Long term (current) use of inhaled steroids: Secondary | ICD-10-CM | POA: Diagnosis not present

## 2024-02-25 DIAGNOSIS — Z6830 Body mass index (BMI) 30.0-30.9, adult: Secondary | ICD-10-CM | POA: Diagnosis not present

## 2024-02-25 DIAGNOSIS — J4541 Moderate persistent asthma with (acute) exacerbation: Secondary | ICD-10-CM | POA: Diagnosis present

## 2024-02-25 DIAGNOSIS — E876 Hypokalemia: Secondary | ICD-10-CM | POA: Diagnosis present

## 2024-02-25 LAB — BASIC METABOLIC PANEL WITH GFR
Anion gap: 11 (ref 5–15)
BUN: 6 mg/dL (ref 6–20)
CO2: 18 mmol/L — ABNORMAL LOW (ref 22–32)
Calcium: 9.1 mg/dL (ref 8.9–10.3)
Chloride: 110 mmol/L (ref 98–111)
Creatinine, Ser: 0.81 mg/dL (ref 0.44–1.00)
GFR, Estimated: >60 mL/min (ref >60)
Glucose, Bld: 131 mg/dL — ABNORMAL HIGH (ref 70–99)
Potassium: 4.2 mmol/L (ref 3.5–5.1)
Sodium: 139 mmol/L (ref 135–145)

## 2024-02-25 LAB — PHOSPHORUS: Phosphorus: 2.5 mg/dL (ref 2.5–4.6)

## 2024-02-25 LAB — MAGNESIUM: Magnesium: 2.4 mg/dL (ref 1.7–2.4)

## 2024-02-25 LAB — HIV ANTIBODY (ROUTINE TESTING W REFLEX): HIV Screen 4th Generation wRfx: NONREACTIVE

## 2024-02-25 MED ORDER — ARFORMOTEROL TARTRATE 15 MCG/2ML IN NEBU
15.0000 ug | INHALATION_SOLUTION | Freq: Two times a day (BID) | RESPIRATORY_TRACT | Status: DC
  Administered 2024-02-25 – 2024-02-26 (×3): 15 ug via RESPIRATORY_TRACT
  Filled 2024-02-25 (×4): qty 2

## 2024-02-25 MED ORDER — BUDESONIDE 0.5 MG/2ML IN SUSP
0.5000 mg | Freq: Two times a day (BID) | RESPIRATORY_TRACT | Status: DC
  Administered 2024-02-25 – 2024-02-26 (×3): 0.5 mg via RESPIRATORY_TRACT
  Filled 2024-02-25 (×4): qty 2

## 2024-02-25 MED ORDER — IPRATROPIUM-ALBUTEROL 0.5-2.5 (3) MG/3ML IN SOLN
3.0000 mL | Freq: Four times a day (QID) | RESPIRATORY_TRACT | Status: DC
  Administered 2024-02-25 – 2024-02-26 (×4): 3 mL via RESPIRATORY_TRACT
  Filled 2024-02-25 (×6): qty 3

## 2024-02-25 MED ORDER — GUAIFENESIN-DM 100-10 MG/5ML PO SYRP
5.0000 mL | ORAL_SOLUTION | ORAL | Status: DC | PRN
  Administered 2024-02-26: 5 mL via ORAL
  Filled 2024-02-25: qty 10

## 2024-02-25 NOTE — Progress Notes (Signed)
 Transition of Care [facility]) - Inpatient Brief Assessment   Patient Details  Name: Laura [name] MRN: [id number] Date of Birth: [birth year]  [facility] CM/SW Contact:    Janae Bridgeman, RN Phone Number: 02/25/2024, 11:21 AM   Clinical Narrative: Patient admitted from home for bronchitis and asthma flare up.  PCP follow up provided in the AVS.  No other TOC needs determined at this time.   Transition of Care Asessment: Insurance and Status: (P) Insurance coverage has been reviewed Patient has primary care physician: (P) No (PCP follow up provided in the AVS.) Home environment has been reviewed: (P) from home Prior level of function:: (P) Independent Prior/Current Home Services: (P) No current home services Social Drivers of Health Review: (P) SDOH reviewed interventions complete Readmission risk has been reviewed: (P) Yes Transition of care needs: (P) no transition of care needs at this time

## 2024-02-25 NOTE — Progress Notes (Signed)
 PROGRESS NOTE  Laura [name]  DOB: [birth year]  PCP: Aviva Kluver UEA:[id number]  DOA: 02/24/2024  LOS: 0 days  Hospital Day: 2  Brief narrative: Laura [name] is a 33 y.o. female with PMH significant for obesity, asthma (intubated in 2019 after an severe asthma exacerbation). 4/8, patient presented to the ED with rapidly progressive shortness of breath. She and her young daughter both have asthma which tends to flareup in the spring.  This year, the flareup was strong.  Her daughter improved at home with inhalers but patient did not and hence presented to ED   In the ED, patient was tachycardic, tachypneic with increased work of breathing, and wheezing. Respiratory virus panel unremarkable Chest x-ray showed mild bronchial thickening. Afebrile, WBC count normal She was given IV Solu-Medrol, DuoNeb No significant improvement and hence kept under observation to [facility]  Subjective: Patient was seen and examined this morning. Pleasant young African-American female.  Propped up in bed.  On low-flow oxygen.  Has diffuse wheezing. No family at bedside.  Assessment and plan: Acute asthma exacerbation Likely triggered by pollen  Denies use of tobacco or exposure to secondhand smoking No fever, no leukocytosis, RVP negative. Chest x-ray without infiltrates.  No evidence of infection. Currently on IV Solu-Medrol but continues to have wheezing. I would continue IV Solu-Medrol for another day at least Continue DuoNeb Encourage ambulation in the hallway   Hypokalemia Potassium level was low and replaced.  Magnesium and phosphorus level normal.  Recent Labs  Lab 02/24/24 1747 02/24/24 1752 02/24/24 1753 02/25/24 0604  K 3.3* 3.2* 3.2* 4.2  MG  --   --   --  2.4  PHOS  --   --   --  2.5   Obesity  Body mass index is 30.9 kg/m. Patient has been advised to make an attempt to improve diet and exercise patterns to aid in weight loss.    Mobility: Encourage ambulation  Goals of care    Code Status: Full Code     DVT prophylaxis:  enoxaparin (LOVENOX) injection 40 mg Start: 02/24/24 2000   Antimicrobials: None Fluid: None Consultants: None Family Communication: None at bedside  Status: Observation Level of care:  Telemetry Medical   Patient is from: Home Needs to continue in-hospital care: Occasional wheezing and requires IV Solu-Medrol Anticipated d/c to: Home hopefully in 1 to 2 days      Diet:  Diet Order             Diet regular Room service appropriate? Yes; Fluid consistency: Thin  Diet effective now                   Scheduled Meds:  arformoterol  15 mcg Nebulization BID   budesonide  0.5 mg Nebulization BID   enoxaparin (LOVENOX) injection  40 mg Subcutaneous Q24H   ipratropium-albuterol  3 mL Nebulization Q6H   methylPREDNISolone (SOLU-MEDROL) injection  40 mg Intravenous Q24H   montelukast  10 mg Oral Daily    PRN meds: acetaminophen, melatonin, polyethylene glycol, prochlorperazine   Infusions:    Antimicrobials: Anti-infectives (From admission, onward)    None       Objective: Vitals:   02/25/24 0552 02/25/24 0721  BP: 135/86 118/75  Pulse: 94 89  Resp: 16 17  Temp: 97.9 F (36.6 C) 97.8 F (36.6 C)  SpO2: 95% 100%   No intake or output data in the 24 hours ending 02/25/24 0949 Filed Weights   02/24/24 1607  Weight: 81.6  kg   Weight change:  Body mass index is 30.9 kg/m.   Physical Exam: General exam: Pleasant, young African-American female Skin: No rashes, lesions or ulcers. HEENT: Atraumatic, normocephalic, no obvious bleeding Lungs: Diffuse bilateral wheezing, cough on deep breathing CVS: S1, S2, no murmur,   GI/Abd: Soft, nontender, nondistended, bowel sound present,   CNS: Alert, awake, oriented x 3 Psychiatry: Mood appropriate,  Extremities: No pedal edema, no calf tenderness,   Data Review: I have personally reviewed the laboratory data and studies available.  F/u labs ordered Unresulted  Labs (From admission, onward)     Start     Ordered   03/02/24 0500  Creatinine, serum  (enoxaparin (LOVENOX)    CrCl >/= 30 ml/min)  Weekly,   R     Comments: while on enoxaparin therapy    02/24/24 1934            Total time spent in review of labs and imaging, patient evaluation, formulation of plan, documentation and communication with family: 45 minutes  Signed, Lorin Glass, MD [facility] 02/25/2024

## 2024-02-26 DIAGNOSIS — J45901 Unspecified asthma with (acute) exacerbation: Secondary | ICD-10-CM | POA: Diagnosis not present

## 2024-02-26 MED ORDER — FLUTICASONE PROPIONATE 50 MCG/ACT NA SUSP: 1.0000 | Freq: Every day | NASAL | 0 refills | Status: AC | PRN

## 2024-02-26 MED ORDER — ARFORMOTEROL TARTRATE 15 MCG/2ML IN NEBU: 15.0000 ug | INHALATION_SOLUTION | Freq: Two times a day (BID) | RESPIRATORY_TRACT | 0 refills | Status: AC

## 2024-02-26 MED ORDER — IPRATROPIUM-ALBUTEROL 0.5-2.5 (3) MG/3ML IN SOLN
3.0000 mL | Freq: Four times a day (QID) | RESPIRATORY_TRACT | 0 refills | Status: AC | PRN
Start: 2024-02-26 — End: ?

## 2024-02-26 MED ORDER — PREDNISONE 10 MG PO TABS: ORAL_TABLET | ORAL | 0 refills | Status: AC

## 2024-02-26 MED ORDER — BUDESONIDE 0.5 MG/2ML IN SUSP: 0.5000 mg | Freq: Two times a day (BID) | RESPIRATORY_TRACT | 0 refills | Status: DC

## 2024-02-26 NOTE — Progress Notes (Addendum)
 Ambulated pt in the hallway on RA, sats 95% at rest before walking, sats remained 95% during the walk on RA, and were 95%-96% RA after walking. Tolerated walk well, denied any SOB.    Discharge instructions reviewed with pt. Copy of instructions given to pt. Pt given printed scripts (3), and others were sent in to her pharmacy electronically and she was informed of this to pick them up.  Pt will be d/c'd via wheelchair with belongings, and will be escorted by staff/hospital volunteer.  Carlin Mamone,RN SWOT

## 2024-02-26 NOTE — Discharge Summary (Addendum)
 Physician Discharge Summary  Laura [name] NFA:[id number] DOB: [birth year] DOA: 02/24/2024  PCP: Pcp, No  Admit date: 02/24/2024 Discharge date: 02/26/2024  Admitted From: Home Discharge disposition: Home  Recommendations at discharge:  Complete the tapering course of prednisone Continue bronchodilators as needed  Brief narrative: Laura [name] is a 33 y.o. female with PMH significant for obesity, asthma (intubated in 2019 after an severe asthma exacerbation). 4/8, patient presented to the ED with rapidly progressive shortness of breath. She and her young daughter both have asthma which tends to flareup in the spring.  This year, the flareup was strong.  Her daughter improved at home with inhalers but patient did not and hence presented to ED   In the ED, patient was tachycardic, tachypneic with increased work of breathing, and wheezing. Respiratory virus panel unremarkable Chest x-ray showed mild bronchial thickening. Afebrile, WBC count normal She was given IV Solu-Medrol, DuoNeb No significant improvement and hence kept under observation to [facility]  Subjective: Patient was seen and examined this morning. Lying down on bed.  Not in distress.  Breathing better than yesterday. Minimal wheezing on auscultation today.  Feels comfortable for discharge.  Hospital course: Acute asthma exacerbation Likely triggered by pollen  Denies use of tobacco or exposure to secondhand smoking No fever, no leukocytosis, RVP negative. Chest x-ray without infiltrates.  No evidence of infection. Currently on IV Solu-Medrol.  Significant improvement in wheezing noticed compared to yesterday Continue tapering course of oral prednisone Continue bronchodilators at home   Hypokalemia Potassium level was low and replaced.  Magnesium and phosphorus level normal.  Recent Labs  Lab 02/24/24 1747 02/24/24 1752 02/24/24 1753 02/25/24 0604  K 3.3* 3.2* 3.2* 4.2  MG  --   --   --  2.4  PHOS  --   --    --  2.5   Obesity  Body mass index is 30.9 kg/m. Patient has been advised to make an attempt to improve diet and exercise patterns to aid in weight loss.  Goals of care   Code Status: Full Code   Diet:  Diet Order             Diet general           Diet regular Room service appropriate? Yes; Fluid consistency: Thin  Diet effective now                   Nutritional status:  Body mass index is 30.9 kg/m.       Wounds:  -    Discharge Exam:   Vitals:   02/26/24 0008 02/26/24 0424 02/26/24 0720 02/26/24 1131  BP: 124/85 108/68 124/76 126/69  Pulse: 89 (!) 103 97 83  Resp: 18 18 18    Temp: 99 F (37.2 C) 98 F (36.7 C) 98.5 F (36.9 C) 98.2 F (36.8 C)  TempSrc: Oral Oral Oral Oral  SpO2: 100% 98% 100% 98%  Weight:      Height:        Body mass index is 30.9 kg/m.   General exam: Pleasant, young African-American female Skin: No rashes, lesions or ulcers. HEENT: Atraumatic, normocephalic, no obvious bleeding Lungs: Much improved auscultation today.  Only minimal scattered wheezing.  Improving cough CVS: S1, S2, no murmur,   GI/Abd: Soft, nontender, nondistended, bowel sound present,   CNS: Alert, awake, oriented x 3 Psychiatry: Mood appropriate,  Extremities: No pedal edema, no calf tenderness,   Follow ups:    Follow-up Information  [facility]. Schedule an appointment as soon as possible for a visit.   Specialty: Internal Medicine Why: Call the office and schedule a hospital follow up in the next 7-10 days. Contact information: [address] [place] [address] [phone]                Discharge Instructions:   Discharge Instructions     Call MD for:  difficulty breathing, headache or visual disturbances   Complete by: As directed    Call MD for:  extreme fatigue   Complete by: As directed    Call MD for:  hives   Complete by: As directed    Call MD for:  persistant dizziness or  light-headedness   Complete by: As directed    Call MD for:  persistant nausea and vomiting   Complete by: As directed    Call MD for:  severe uncontrolled pain   Complete by: As directed    Call MD for:  temperature >100.4   Complete by: As directed    Diet general   Complete by: As directed    Discharge instructions   Complete by: As directed    Recommendations at discharge:   Complete the tapering course of prednisone  Continue bronchodilators as needed  General discharge instructions: Follow with Primary MD Pcp, No in 7 days  Please request your PCP  to go over your hospital tests, procedures, radiology results at the follow up. Please get your medicines reviewed and adjusted.  Your PCP may decide to repeat certain labs or tests as needed. Do not drive, operate heavy machinery, perform activities at heights, swimming or participation in water activities or provide baby sitting services if your were admitted for syncope or siezures until you have seen by Primary MD or a Neurologist and advised to do so again. [place] Controlled Substance Reporting System database was reviewed. Do not drive, operate heavy machinery, perform activities at heights, swim, participate in water activities or provide baby-sitting services while on medications for pain, sleep and mood until your outpatient physician has reevaluated you and advised to do so again.  You are strongly recommended to comply with the dose, frequency and duration of prescribed medications. Activity: As tolerated with Full fall precautions use walker/cane & assistance as needed Avoid using any recreational substances like cigarette, tobacco, alcohol, or non-prescribed drug. If you experience worsening of your admission symptoms, develop shortness of breath, life threatening emergency, suicidal or homicidal thoughts you must seek medical attention immediately by calling 911 or calling your MD immediately  if symptoms less  severe. You must read complete instructions/literature along with all the possible adverse reactions/side effects for all the medicines you take and that have been prescribed to you. Take any new medicine only after you have completely understood and accepted all the possible adverse reactions/side effects.  Wear Seat belts while driving. You were cared for by a hospitalist during your hospital stay. If you have any questions about your discharge medications or the care you received while you were in the hospital after you are discharged, you can call the unit and ask to speak with the hospitalist or the covering physician. Once you are discharged, your primary care physician will handle any further medical issues. Please note that NO REFILLS for any discharge medications will be authorized once you are discharged, as it is imperative that you return to your primary care physician (or establish a relationship with a primary  care physician if you do not have one).   Increase activity slowly   Complete by: As directed        Discharge Medications:   Allergies as of 02/26/2024       Reactions   Other Swelling   Seafood, facial swelling Facial swelling, mouth swelling   Shellfish Allergy         Medication List     STOP taking these medications    cefdinir 300 MG capsule Commonly known as: OMNICEF       TAKE these medications    albuterol 108 (90 Base) MCG/ACT inhaler Commonly known as: VENTOLIN HFA Inhale 1-2 puffs into the lungs every 6 (six) hours as needed for wheezing or shortness of breath.   arformoterol 15 MCG/2ML Nebu Commonly known as: BROVANA Take 2 mLs (15 mcg total) by nebulization 2 (two) times daily.   budesonide 0.5 MG/2ML nebulizer solution Commonly known as: PULMICORT Take 2 mLs (0.5 mg total) by nebulization 2 (two) times daily.   fluticasone 50 MCG/ACT nasal spray Commonly known as: FLONASE Place 1 spray into both nostrils daily as needed for  allergies.   guaiFENesin 100 MG/5ML Soln Commonly known as: ROBITUSSIN Take 5 mLs (100 mg total) by mouth every 4 (four) hours as needed for cough or to loosen phlegm.   ipratropium-albuterol 0.5-2.5 (3) MG/3ML Soln Commonly known as: DUONEB Take 3 mLs by nebulization every 6 (six) hours as needed (sob and wheezing).   montelukast 10 MG tablet Commonly known as: SINGULAIR Take 10 mg by mouth daily as needed (for allergies).   predniSONE 10 MG tablet Commonly known as: DELTASONE Take 4 tablets daily X 2 days, then, Take 3 tablets daily X 2 days, then, Take 2 tablets daily X 2 days, then, Take 1 tablets daily X 1 day. Notes to patient: TAKE A DOSE WHEN YOU GET MED FROM PHARMACY TODAY, THEN RESUME IN THE MORNING AS DIRECTED    traZODone 50 MG tablet Commonly known as: DESYREL Take 50 mg by mouth at bedtime as needed for sleep.         The results of significant diagnostics from this hospitalization (including imaging, microbiology, ancillary and laboratory) are listed below for reference.    Procedures and Diagnostic Studies:   DG Chest 2 View Result Date: 02/24/2024 CLINICAL DATA:  sob, asthma EXAM: CHEST - 2 VIEW COMPARISON:  None Available. FINDINGS: The cardiomediastinal contours are normal. Mild bronchial thickening. Pulmonary vasculature is normal. No consolidation, pleural effusion, or pneumothorax. No acute osseous abnormalities are seen. IMPRESSION: Mild bronchial thickening. Electronically Signed   By: Narda Rutherford M.D.   On: 02/24/2024 12:25     Labs:   Basic Metabolic Panel: Recent Labs  Lab 02/24/24 1747 02/24/24 1752 02/24/24 1753 02/25/24 0604  NA 141 141 141 139  K 3.3* 3.2* 3.2* 4.2  CL 109  --  111 110  CO2 17*  --   --  18*  GLUCOSE 118*  --  114* 131*  BUN 5*  --  5* 6  CREATININE 0.90  --  0.90 0.81  CALCIUM 8.7*  --   --  9.1  MG  --   --   --  2.4  PHOS  --   --   --  2.5   GFR Estimated Creatinine Clearance: 104.1 mL/min (by C-G  formula based on SCr of 0.81 mg/dL). Liver Function Tests: No results for input(s): "AST", "ALT", "ALKPHOS", "BILITOT", "PROT", "ALBUMIN" in the last 168 hours.  No results for input(s): "LIPASE", "AMYLASE" in the last 168 hours. No results for input(s): "AMMONIA" in the last 168 hours. Coagulation profile No results for input(s): "INR", "PROTIME" in the last 168 hours.  CBC: Recent Labs  Lab 02/24/24 1747 02/24/24 1752 02/24/24 1753  WBC 9.9  --   --   NEUTROABS 9.0*  --   --   HGB 12.4 13.6 13.9  HCT 40.2 40.0 41.0  MCV 76.3*  --   --   PLT 367  --   --    Cardiac Enzymes: No results for input(s): "CKTOTAL", "CKMB", "CKMBINDEX", "TROPONINI" in the last 168 hours. BNP: Invalid input(s): "POCBNP" CBG: No results for input(s): "GLUCAP" in the last 168 hours. D-Dimer Recent Labs    02/24/24 1747  DDIMER 0.47   Hgb A1c No results for input(s): "HGBA1C" in the last 72 hours. Lipid Profile No results for input(s): "CHOL", "HDL", "LDLCALC", "TRIG", "CHOLHDL", "LDLDIRECT" in the last 72 hours. Thyroid function studies No results for input(s): "TSH", "T4TOTAL", "T3FREE", "THYROIDAB" in the last 72 hours.  Invalid input(s): "FREET3" Anemia work up No results for input(s): "VITAMINB12", "FOLATE", "FERRITIN", "TIBC", "IRON", "RETICCTPCT" in the last 72 hours. Microbiology Recent Results (from the past 240 hours)  Resp panel by RT-PCR (RSV, Flu A&B, Covid) Anterior Nasal Swab     Status: None   Collection Time: 02/24/24 11:40 AM   Specimen: Anterior Nasal Swab  Result Value Ref Range Status   SARS Coronavirus 2 by RT PCR NEGATIVE NEGATIVE Final   Influenza A by PCR NEGATIVE NEGATIVE Final   Influenza B by PCR NEGATIVE NEGATIVE Final    Comment: (NOTE) The Xpert Xpress SARS-CoV-2/FLU/RSV plus assay is intended as an aid in the diagnosis of influenza from Nasopharyngeal swab specimens and should not be used as a sole basis for treatment. Nasal washings and aspirates are  unacceptable for Xpert Xpress SARS-CoV-2/FLU/RSV testing.  Fact Sheet for Patients: BloggerCourse.com  Fact Sheet for Healthcare Providers: SeriousBroker.it  This test is not yet approved or cleared by the [place] FDA and has been authorized for detection and/or diagnosis of SARS-CoV-2 by FDA under an Emergency Use Authorization (EUA). This EUA will remain in effect (meaning this test can be used) for the duration of the COVID-19 declaration under Section 564(b)(1) of the Act, 21 U.S.C. section 360bbb-3(b)(1), unless the authorization is terminated or revoked.     Resp Syncytial Virus by PCR NEGATIVE NEGATIVE Final    Comment: (NOTE) Fact Sheet for Patients: BloggerCourse.com  Fact Sheet for Healthcare Providers: SeriousBroker.it  This test is not yet approved or cleared by the [place] FDA and has been authorized for detection and/or diagnosis of SARS-CoV-2 by FDA under an Emergency Use Authorization (EUA). This EUA will remain in effect (meaning this test can be used) for the duration of the COVID-19 declaration under Section 564(b)(1) of the Act, 21 U.S.C. section 360bbb-3(b)(1), unless the authorization is terminated or revoked.  Performed at [facility], [address], [place], [place] [address]     Time coordinating discharge: 45 minutes  Signed: Annisha Baar  [facility] 02/26/2024, 2:45 PM

## 2024-02-26 NOTE — Plan of Care (Signed)

## 2024-02-26 NOTE — Progress Notes (Signed)
 Came to room for breathing treatment x 2 attempts.  Pt is not in room or in bathroom x 2.

## 2024-09-07 ENCOUNTER — Ambulatory Visit: Payer: Self-pay

## 2024-09-07 NOTE — Telephone Encounter (Signed)
 FYI Only or Action Required?: Action required by provider: request for appointment.  Patient is followed in Pulmonology for [name] New Patient, last seen on .  Called Nurse Triage reporting No chief complaint on file..   Triage Disposition: See PCP Within 2 Weeks  Patient/caregiver understands and will follow disposition?:    Copied from CRM #[id number]. Topic: Clinical - Red Word Triage >> Sep 07, 2024  1:58 PM Laura [name] wrote: Red Word that prompted transfer to Nurse Triage: patient stated she has been having SOB and wheezing everyday for the last week and [name] - she has asthma Reason for Disposition  Requesting regular office appointment  Answer Assessment - Initial Assessment Questions 1. REASON FOR CALL: What is the main reason for your call? or How can I best help you?     I need to schedule [name] new patient appointment with [name] pulmonologist as I am running low with on my medications.  2. SYMPTOMS : Do you have any symptoms?      No, not really, my normal flare ups as the weather is changing.   3. OTHER QUESTIONS: Do you have any other questions?     No  The patient called only to make [name] new patient appointment and declined triage.  Protocols used: Information Only Call - No Triage-[name]-AH

## 2024-09-29 ENCOUNTER — Encounter: Payer: Self-pay | Admitting: Obstetrics and Gynecology

## 2024-09-29 NOTE — Progress Notes (Signed)
   ANNUAL EXAM Patient name: Laura [name] MRN [id number]  Date of birth: [birth year] Chief Complaint:   Gynecologic Exam (Pt reported would like to start Depo. )  History of Present Illness:   Laura [name] is a 33 y.o. G67P1011 female being seen today for a routine annual exam.   Current concerns: None.   Current birth control: None but would like resume Depo. Would like another baby at some point.   Had breast lump - saw specialist and then no additional work up needed. It has shrank down.   Patient's last menstrual period was 09/01/2024 (approximate).  Gardasil: Completed Last Pap/Pap History:  H/O abnormal pap: no  Review of Systems:   Pertinent items are noted in HPI Denies any headaches, blurred vision, fatigue, shortness of breath, chest pain, abdominal pain, abnormal vaginal discharge/itching/odor/irritation, problems with periods, bowel movements, urination, or intercourse unless otherwise stated above.  Pertinent History Reviewed:  Reviewed past medical,surgical, social and family history.  Reviewed problem list, medications and allergies. Physical Assessment:   Vitals:   09/30/24 0921  BP: 120/76  Pulse: 92  Weight: 172 lb (78 kg)  Height: 5' 4 (1.626 m)  Body mass index is 29.52 kg/m.   Physical Examination:  General appearance - well appearing, and in no distress Mental status - alert, oriented to person, place, and time Psych:  She has a normal mood and affect Skin - warm and dry, normal color, no suspicious lesions noted Chest - effort normal Heart - normal rate  Breasts - breasts appear normal, no suspicious masses, no skin or nipple changes or axillary nodes Abdomen - soft, nontender, nondistended, no masses or organomegaly Pelvic -  Performed and: VULVA: normal appearing vulva with no masses, tenderness or lesions VAGINA: normal appearing vagina with normal color and discharge, no lesions CERVIX: normal appearing cervix without discharge or  lesions, no CMT. UTERUS: Not examined ADNEXA: Not examined Extremities:  No swelling or varicosities noted  Chaperone present for exam  Results for orders placed or performed in visit on 09/30/24 (from the past 24 hours)  POCT urine pregnancy   Collection Time: 09/30/24  9:47 AM  Result Value Ref Range   Preg Test, Ur Positive (A) Negative    Assessment & Plan:  Kona was seen today for gynecologic exam.  Diagnoses and all orders for this visit:  Encounter for annual routine gynecological examination - Cervical cancer screening: Discussed guidelines. Pap with HPV done - Gardasil: completed - GC/CT: accepts - Birth Control: N/A - + UPT - Breast Health: Encouraged self breast awareness/SBE. Teaching provided.  - F/U 12 months and prn -     Cytology - PAP -     RPR+HBsAg+HCVAb+...  Depo-Provera contraceptive status Not given today due to positive UPT.  -     POCT urine pregnancy  Screen for STD (sexually transmitted disease) -     Cytology - PAP -     RPR+HBsAg+HCVAb+...  Positive pregnancy test - Unplanned pregnancy. Unsure of plans.  - Reviewed if plans to continue pregnancy, we [facility] in [place]. Discussed early US  for dating.    Orders Placed This Encounter  Procedures   RPR+HBsAg+HCVAb+...   POCT urine pregnancy    Meds: No orders of the defined types were placed in this encounter.   Follow-up: No follow-ups on file.  Vina Solian, MD 09/30/2024 9:59 AM

## 2024-09-30 ENCOUNTER — Other Ambulatory Visit (HOSPITAL_COMMUNITY)
Admission: RE | Admit: 2024-09-30 | Discharge: 2024-09-30 | Disposition: A | Discharge status: Home / Self Care | Source: Ambulatory Visit | Attending: Obstetrics and Gynecology | Admitting: Obstetrics and Gynecology

## 2024-09-30 ENCOUNTER — Ambulatory Visit (INDEPENDENT_AMBULATORY_CARE_PROVIDER_SITE_OTHER): Admitting: Obstetrics and Gynecology

## 2024-09-30 ENCOUNTER — Encounter: Payer: Self-pay | Admitting: Obstetrics and Gynecology

## 2024-09-30 VITALS — BP 120/76 | HR 92 | Ht 64.0 in | Wt 172.0 lb

## 2024-09-30 DIAGNOSIS — Z01419 Encounter for gynecological examination (general) (routine) without abnormal findings: Secondary | ICD-10-CM

## 2024-09-30 DIAGNOSIS — Z113 Encounter for screening for infections with a predominantly sexual mode of transmission: Secondary | ICD-10-CM | POA: Diagnosis present

## 2024-09-30 DIAGNOSIS — Z3201 Encounter for pregnancy test, result positive: Secondary | ICD-10-CM | POA: Diagnosis not present

## 2024-09-30 DIAGNOSIS — Z3042 Encounter for surveillance of injectable contraceptive: Secondary | ICD-10-CM

## 2024-09-30 LAB — POCT URINE PREGNANCY: Preg Test, Ur: POSITIVE — AB

## 2024-10-01 LAB — RPR+HBSAG+HCVAB+...
HIV Screen 4th Generation wRfx: NONREACTIVE
Hep C Virus Ab: NONREACTIVE
Hepatitis B Surface Ag: NEGATIVE
RPR Ser Ql: NONREACTIVE

## 2024-10-05 ENCOUNTER — Ambulatory Visit: Payer: Self-pay | Admitting: Obstetrics and Gynecology

## 2024-10-05 LAB — CYTOLOGY - PAP
Chlamydia: NEGATIVE
Comment: NEGATIVE
Comment: NEGATIVE
Comment: NEGATIVE
Comment: NORMAL
Diagnosis: NEGATIVE
Diagnosis: REACTIVE
High risk HPV: NEGATIVE
Neisseria Gonorrhea: NEGATIVE
Trichomonas: NEGATIVE
